# Patient Record
Sex: Female | Born: 1940 | Race: White | Hispanic: No | Smoking: Former smoker
Health system: Southern US, Community
[De-identification: ages and names within clinical notes are randomized; demographics above are authoritative.]

## PROBLEM LIST (undated history)

## (undated) DIAGNOSIS — E785 Hyperlipidemia, unspecified: Secondary | ICD-10-CM

## (undated) DIAGNOSIS — C189 Malignant neoplasm of colon, unspecified: Secondary | ICD-10-CM

## (undated) DIAGNOSIS — Z923 Personal history of irradiation: Secondary | ICD-10-CM

## (undated) DIAGNOSIS — Z9221 Personal history of antineoplastic chemotherapy: Secondary | ICD-10-CM

## (undated) DIAGNOSIS — I700 Atherosclerosis of aorta: Secondary | ICD-10-CM

## (undated) DIAGNOSIS — R918 Other nonspecific abnormal finding of lung field: Secondary | ICD-10-CM

## (undated) DIAGNOSIS — G4700 Insomnia, unspecified: Secondary | ICD-10-CM

## (undated) DIAGNOSIS — D649 Anemia, unspecified: Secondary | ICD-10-CM

---

## 1999-09-07 DIAGNOSIS — C189 Malignant neoplasm of colon, unspecified: Secondary | ICD-10-CM

## 1999-09-07 HISTORY — DX: Malignant neoplasm of colon, unspecified: C18.9

## 2004-07-22 ENCOUNTER — Ambulatory Visit: Payer: Self-pay | Admitting: Internal Medicine

## 2004-08-06 ENCOUNTER — Ambulatory Visit: Payer: Self-pay | Admitting: Internal Medicine

## 2004-09-01 ENCOUNTER — Ambulatory Visit: Payer: Self-pay

## 2004-10-21 ENCOUNTER — Ambulatory Visit: Payer: Self-pay | Admitting: Internal Medicine

## 2004-11-04 ENCOUNTER — Ambulatory Visit: Payer: Self-pay | Admitting: Internal Medicine

## 2004-12-24 ENCOUNTER — Ambulatory Visit: Payer: Self-pay | Admitting: Radiation Oncology

## 2005-01-18 ENCOUNTER — Ambulatory Visit: Payer: Self-pay | Admitting: Internal Medicine

## 2005-02-04 ENCOUNTER — Ambulatory Visit: Payer: Self-pay | Admitting: Internal Medicine

## 2005-04-23 ENCOUNTER — Ambulatory Visit: Payer: Self-pay | Admitting: Internal Medicine

## 2005-05-07 ENCOUNTER — Ambulatory Visit: Payer: Self-pay | Admitting: Internal Medicine

## 2005-07-23 ENCOUNTER — Ambulatory Visit: Payer: Self-pay | Admitting: Internal Medicine

## 2005-08-06 ENCOUNTER — Ambulatory Visit: Payer: Self-pay | Admitting: Internal Medicine

## 2005-09-03 ENCOUNTER — Ambulatory Visit: Payer: Self-pay

## 2005-11-19 ENCOUNTER — Ambulatory Visit: Payer: Self-pay | Admitting: Internal Medicine

## 2005-12-05 ENCOUNTER — Ambulatory Visit: Payer: Self-pay | Admitting: Internal Medicine

## 2006-01-28 ENCOUNTER — Ambulatory Visit: Payer: Self-pay | Admitting: Internal Medicine

## 2006-02-04 ENCOUNTER — Ambulatory Visit: Payer: Self-pay | Admitting: Internal Medicine

## 2006-04-01 ENCOUNTER — Ambulatory Visit: Payer: Self-pay | Admitting: Internal Medicine

## 2006-04-06 ENCOUNTER — Ambulatory Visit: Payer: Self-pay | Admitting: Internal Medicine

## 2006-07-22 ENCOUNTER — Ambulatory Visit: Payer: Self-pay | Admitting: Internal Medicine

## 2006-08-06 ENCOUNTER — Ambulatory Visit: Payer: Self-pay | Admitting: Internal Medicine

## 2006-09-09 ENCOUNTER — Ambulatory Visit: Payer: Self-pay

## 2006-10-26 ENCOUNTER — Ambulatory Visit: Payer: Self-pay | Admitting: Unknown Physician Specialty

## 2006-11-18 ENCOUNTER — Ambulatory Visit: Payer: Self-pay | Admitting: Internal Medicine

## 2006-12-06 ENCOUNTER — Ambulatory Visit: Payer: Self-pay | Admitting: Internal Medicine

## 2007-01-05 ENCOUNTER — Ambulatory Visit: Payer: Self-pay | Admitting: Internal Medicine

## 2007-01-20 ENCOUNTER — Ambulatory Visit: Payer: Self-pay | Admitting: Internal Medicine

## 2007-02-05 ENCOUNTER — Ambulatory Visit: Payer: Self-pay | Admitting: Internal Medicine

## 2007-03-24 ENCOUNTER — Ambulatory Visit: Payer: Self-pay | Admitting: Internal Medicine

## 2007-04-07 ENCOUNTER — Ambulatory Visit: Payer: Self-pay | Admitting: Internal Medicine

## 2007-07-08 ENCOUNTER — Ambulatory Visit: Payer: Self-pay | Admitting: Internal Medicine

## 2007-07-21 ENCOUNTER — Ambulatory Visit: Payer: Self-pay | Admitting: Internal Medicine

## 2007-08-07 ENCOUNTER — Ambulatory Visit: Payer: Self-pay | Admitting: Internal Medicine

## 2007-09-15 ENCOUNTER — Ambulatory Visit: Payer: Self-pay

## 2007-10-20 ENCOUNTER — Ambulatory Visit: Payer: Self-pay | Admitting: Internal Medicine

## 2007-11-05 ENCOUNTER — Ambulatory Visit: Payer: Self-pay | Admitting: Internal Medicine

## 2008-01-05 ENCOUNTER — Ambulatory Visit: Payer: Self-pay | Admitting: Internal Medicine

## 2008-01-19 ENCOUNTER — Ambulatory Visit: Payer: Self-pay | Admitting: Internal Medicine

## 2008-02-05 ENCOUNTER — Ambulatory Visit: Payer: Self-pay | Admitting: Internal Medicine

## 2008-05-17 ENCOUNTER — Ambulatory Visit: Payer: Self-pay | Admitting: Internal Medicine

## 2008-06-06 ENCOUNTER — Ambulatory Visit: Payer: Self-pay | Admitting: Internal Medicine

## 2008-09-06 ENCOUNTER — Ambulatory Visit: Payer: Self-pay | Admitting: Internal Medicine

## 2008-09-13 ENCOUNTER — Ambulatory Visit: Payer: Self-pay | Admitting: Internal Medicine

## 2008-09-20 ENCOUNTER — Ambulatory Visit: Payer: Self-pay

## 2008-10-07 ENCOUNTER — Ambulatory Visit: Payer: Self-pay | Admitting: Internal Medicine

## 2009-01-04 ENCOUNTER — Ambulatory Visit: Payer: Self-pay | Admitting: Internal Medicine

## 2009-01-10 ENCOUNTER — Ambulatory Visit: Payer: Self-pay | Admitting: Internal Medicine

## 2009-02-04 ENCOUNTER — Ambulatory Visit: Payer: Self-pay | Admitting: Internal Medicine

## 2009-05-16 ENCOUNTER — Ambulatory Visit: Payer: Self-pay | Admitting: Internal Medicine

## 2009-06-06 ENCOUNTER — Ambulatory Visit: Payer: Self-pay | Admitting: Internal Medicine

## 2009-09-12 ENCOUNTER — Ambulatory Visit: Payer: Self-pay | Admitting: Internal Medicine

## 2009-09-22 ENCOUNTER — Ambulatory Visit: Payer: Self-pay

## 2009-10-07 ENCOUNTER — Ambulatory Visit: Payer: Self-pay | Admitting: Internal Medicine

## 2010-01-04 ENCOUNTER — Ambulatory Visit: Payer: Self-pay | Admitting: Internal Medicine

## 2010-01-09 ENCOUNTER — Ambulatory Visit: Payer: Self-pay | Admitting: Internal Medicine

## 2010-02-04 ENCOUNTER — Ambulatory Visit: Payer: Self-pay | Admitting: Internal Medicine

## 2010-05-15 ENCOUNTER — Ambulatory Visit: Payer: Self-pay | Admitting: Internal Medicine

## 2010-06-06 ENCOUNTER — Ambulatory Visit: Payer: Self-pay | Admitting: Internal Medicine

## 2010-09-18 ENCOUNTER — Ambulatory Visit: Payer: Self-pay | Admitting: Internal Medicine

## 2010-09-24 LAB — CEA: CEA: 1.1 ng/mL (ref 0.0–4.7)

## 2010-09-30 ENCOUNTER — Ambulatory Visit: Payer: Self-pay

## 2010-10-07 ENCOUNTER — Ambulatory Visit: Payer: Self-pay | Admitting: Internal Medicine

## 2010-12-16 ENCOUNTER — Ambulatory Visit: Payer: Self-pay | Admitting: Unknown Physician Specialty

## 2010-12-17 LAB — PATHOLOGY REPORT

## 2011-01-22 ENCOUNTER — Ambulatory Visit: Payer: Self-pay | Admitting: Internal Medicine

## 2011-01-23 LAB — CEA: CEA: 1.6 ng/mL (ref 0.0–4.7)

## 2011-02-05 ENCOUNTER — Ambulatory Visit: Payer: Self-pay | Admitting: Internal Medicine

## 2011-10-12 ENCOUNTER — Ambulatory Visit: Payer: Self-pay

## 2012-10-13 ENCOUNTER — Ambulatory Visit: Payer: Self-pay

## 2013-10-15 ENCOUNTER — Ambulatory Visit: Payer: Self-pay

## 2014-02-14 ENCOUNTER — Ambulatory Visit: Payer: Self-pay | Admitting: Unknown Physician Specialty

## 2016-01-08 ENCOUNTER — Other Ambulatory Visit: Payer: Self-pay | Admitting: Family Medicine

## 2016-01-08 DIAGNOSIS — Z1231 Encounter for screening mammogram for malignant neoplasm of breast: Secondary | ICD-10-CM

## 2016-01-16 ENCOUNTER — Ambulatory Visit
Admission: RE | Admit: 2016-01-16 | Discharge: 2016-01-16 | Disposition: A | Discharge status: Home / Self Care | Payer: Medicare Other | Source: Ambulatory Visit | Attending: Family Medicine | Admitting: Family Medicine

## 2016-01-16 ENCOUNTER — Other Ambulatory Visit: Payer: Self-pay | Admitting: Family Medicine

## 2016-01-16 DIAGNOSIS — Z1231 Encounter for screening mammogram for malignant neoplasm of breast: Secondary | ICD-10-CM

## 2016-01-16 HISTORY — DX: Malignant neoplasm of colon, unspecified: C18.9

## 2016-12-22 ENCOUNTER — Other Ambulatory Visit: Payer: Self-pay | Admitting: Family Medicine

## 2016-12-22 DIAGNOSIS — Z1231 Encounter for screening mammogram for malignant neoplasm of breast: Secondary | ICD-10-CM

## 2017-01-24 ENCOUNTER — Ambulatory Visit
Admission: RE | Admit: 2017-01-24 | Discharge: 2017-01-24 | Disposition: A | Discharge status: Home / Self Care | Payer: Medicare Other | Source: Ambulatory Visit | Attending: Family Medicine | Admitting: Family Medicine

## 2017-01-24 DIAGNOSIS — Z1231 Encounter for screening mammogram for malignant neoplasm of breast: Secondary | ICD-10-CM | POA: Insufficient documentation

## 2017-01-26 ENCOUNTER — Other Ambulatory Visit: Payer: Self-pay | Admitting: Family Medicine

## 2017-01-26 DIAGNOSIS — R928 Other abnormal and inconclusive findings on diagnostic imaging of breast: Secondary | ICD-10-CM

## 2017-01-26 DIAGNOSIS — R921 Mammographic calcification found on diagnostic imaging of breast: Secondary | ICD-10-CM

## 2017-02-04 ENCOUNTER — Ambulatory Visit
Admission: RE | Admit: 2017-02-04 | Discharge: 2017-02-04 | Disposition: A | Discharge status: Home / Self Care | Payer: Medicare Other | Source: Ambulatory Visit | Attending: Family Medicine | Admitting: Family Medicine

## 2017-02-04 DIAGNOSIS — R921 Mammographic calcification found on diagnostic imaging of breast: Secondary | ICD-10-CM

## 2017-02-04 DIAGNOSIS — R928 Other abnormal and inconclusive findings on diagnostic imaging of breast: Secondary | ICD-10-CM

## 2017-02-10 ENCOUNTER — Other Ambulatory Visit: Payer: Self-pay | Admitting: Family Medicine

## 2017-02-10 DIAGNOSIS — R921 Mammographic calcification found on diagnostic imaging of breast: Secondary | ICD-10-CM

## 2017-02-10 DIAGNOSIS — R928 Other abnormal and inconclusive findings on diagnostic imaging of breast: Secondary | ICD-10-CM

## 2017-02-14 ENCOUNTER — Ambulatory Visit
Admission: RE | Admit: 2017-02-14 | Discharge: 2017-02-14 | Disposition: A | Discharge status: Home / Self Care | Payer: Medicare Other | Source: Ambulatory Visit | Attending: Family Medicine | Admitting: Family Medicine

## 2017-02-14 DIAGNOSIS — R921 Mammographic calcification found on diagnostic imaging of breast: Secondary | ICD-10-CM | POA: Insufficient documentation

## 2017-02-14 DIAGNOSIS — R928 Other abnormal and inconclusive findings on diagnostic imaging of breast: Secondary | ICD-10-CM

## 2017-02-14 DIAGNOSIS — N6021 Fibroadenosis of right breast: Secondary | ICD-10-CM | POA: Diagnosis not present

## 2017-02-14 HISTORY — PX: BREAST BIOPSY: SHX20

## 2017-02-15 LAB — SURGICAL PATHOLOGY

## 2018-01-06 ENCOUNTER — Other Ambulatory Visit: Payer: Self-pay | Admitting: Family Medicine

## 2018-01-06 DIAGNOSIS — Z1231 Encounter for screening mammogram for malignant neoplasm of breast: Secondary | ICD-10-CM

## 2018-02-20 ENCOUNTER — Ambulatory Visit
Admission: RE | Admit: 2018-02-20 | Discharge: 2018-02-20 | Disposition: A | Discharge status: Home / Self Care | Payer: Medicare Other | Source: Ambulatory Visit | Attending: Family Medicine | Admitting: Family Medicine

## 2018-02-20 DIAGNOSIS — Z1231 Encounter for screening mammogram for malignant neoplasm of breast: Secondary | ICD-10-CM | POA: Diagnosis present

## 2018-02-20 HISTORY — DX: Personal history of irradiation: Z92.3

## 2018-02-20 HISTORY — DX: Personal history of antineoplastic chemotherapy: Z92.21

## 2019-01-15 ENCOUNTER — Other Ambulatory Visit: Payer: Self-pay | Admitting: Family Medicine

## 2019-01-15 DIAGNOSIS — Z1231 Encounter for screening mammogram for malignant neoplasm of breast: Secondary | ICD-10-CM

## 2019-03-05 ENCOUNTER — Ambulatory Visit
Admission: RE | Admit: 2019-03-05 | Discharge: 2019-03-05 | Disposition: A | Discharge status: Home / Self Care | Payer: Medicare Other | Source: Ambulatory Visit | Attending: Family Medicine | Admitting: Family Medicine

## 2019-03-05 ENCOUNTER — Other Ambulatory Visit: Payer: Self-pay

## 2019-03-05 DIAGNOSIS — Z1231 Encounter for screening mammogram for malignant neoplasm of breast: Secondary | ICD-10-CM

## 2020-01-30 ENCOUNTER — Other Ambulatory Visit: Payer: Self-pay | Admitting: Family Medicine

## 2020-01-30 DIAGNOSIS — Z1231 Encounter for screening mammogram for malignant neoplasm of breast: Secondary | ICD-10-CM

## 2020-03-11 ENCOUNTER — Ambulatory Visit
Admission: RE | Admit: 2020-03-11 | Discharge: 2020-03-11 | Disposition: A | Discharge status: Home / Self Care | Payer: Medicare Other | Source: Ambulatory Visit | Attending: Family Medicine | Admitting: Family Medicine

## 2020-03-11 DIAGNOSIS — Z1231 Encounter for screening mammogram for malignant neoplasm of breast: Secondary | ICD-10-CM | POA: Insufficient documentation

## 2020-10-27 ENCOUNTER — Other Ambulatory Visit: Payer: Self-pay

## 2020-10-27 ENCOUNTER — Ambulatory Visit (INDEPENDENT_AMBULATORY_CARE_PROVIDER_SITE_OTHER): Payer: Medicare Other | Admitting: Dermatology

## 2020-10-27 DIAGNOSIS — L578 Other skin changes due to chronic exposure to nonionizing radiation: Secondary | ICD-10-CM

## 2020-10-27 DIAGNOSIS — C44319 Basal cell carcinoma of skin of other parts of face: Secondary | ICD-10-CM | POA: Diagnosis not present

## 2020-10-27 DIAGNOSIS — D485 Neoplasm of uncertain behavior of skin: Secondary | ICD-10-CM

## 2020-10-27 NOTE — Progress Notes (Signed)
   New Patient Visit  Subjective  Cindy Finley is a 80 y.o. female who presents for the following: Other (Spot of right cheek that has been there for months and is not healing.).  Referred by Dr Loney Hering  The following portions of the chart were reviewed this encounter and updated as appropriate:   Allergies  Meds  Problems  Med Hx  Surg Hx  Fam Hx     Review of Systems:  No other skin or systemic complaints except as noted in HPI or Assessment and Plan.  Objective  Well appearing patient in no apparent distress; mood and affect are within normal limits.  A focused examination was performed including face. Relevant physical exam findings are noted in the Assessment and Plan.  Objective  Right Buccal Cheek: ~3.0 cm pink patch with 2.5 cm of thickened area at the center        Assessment & Plan  Neoplasm of uncertain behavior of skin Right Buccal Cheek  Skin / nail biopsy Type of biopsy: tangential   Informed consent: discussed and consent obtained   Timeout: patient name, date of birth, surgical site, and procedure verified   Procedure prep:  Patient was prepped and draped in usual sterile fashion Prep type:  Isopropyl alcohol Anesthesia: the lesion was anesthetized in a standard fashion   Anesthetic:  1% lidocaine w/ epinephrine 1-100,000 buffered w/ 8.4% NaHCO3 Instrument used: flexible razor blade   Hemostasis achieved with: pressure, aluminum chloride and electrodesiccation   Outcome: patient tolerated procedure well   Post-procedure details: sterile dressing applied and wound care instructions given   Dressing type: bandage and petrolatum    Specimen 1 - Surgical pathology Differential Diagnosis: BCC vs other  Check Margins: No ~3.0 cm pink patch with 2.5 cm of thickened area at the center  Will plan radiation therapy if +BCC.  Actinic Damage - chronic, secondary to cumulative UV radiation exposure/sun exposure over time - diffuse scaly erythematous  macules with underlying dyspigmentation - Recommend daily broad spectrum sunscreen SPF 30+ to sun-exposed areas, reapply every 2 hours as needed.  - Call for new or changing lesions.  Return in about 6 months (around 04/26/2021) for TBSE.  I, Ashok Cordia, CMA, am acting as scribe for Sarina Ser, MD .  Documentation: I have reviewed the above documentation for accuracy and completeness, and I agree with the above.  Sarina Ser, MD

## 2020-10-27 NOTE — Patient Instructions (Signed)

## 2020-10-28 ENCOUNTER — Encounter: Payer: Self-pay | Admitting: Dermatology

## 2020-10-30 DIAGNOSIS — C4491 Basal cell carcinoma of skin, unspecified: Secondary | ICD-10-CM

## 2020-10-30 HISTORY — DX: Basal cell carcinoma of skin, unspecified: C44.91

## 2020-11-06 ENCOUNTER — Other Ambulatory Visit: Payer: Self-pay

## 2020-11-06 ENCOUNTER — Telehealth: Payer: Self-pay

## 2020-11-06 DIAGNOSIS — C44319 Basal cell carcinoma of skin of other parts of face: Secondary | ICD-10-CM

## 2020-11-06 NOTE — Telephone Encounter (Signed)
Called pt discussed biopsy results  

## 2020-11-06 NOTE — Telephone Encounter (Signed)
-----   Message from Ralene Bathe, MD sent at 10/30/2020  5:29 PM EST ----- Diagnosis Skin , right buccal cheek BASAL CELL CARCINOMA, NODULAR PATTERN  Cancer - BCC As suspected Large lesion Recommend Radiation as discussed.  Please schedule pt with Dr Baruch Gouty at [facility] for Radiation

## 2020-11-06 NOTE — Telephone Encounter (Signed)
Referral faxed to [facility] radiation

## 2020-11-13 ENCOUNTER — Encounter: Payer: Self-pay | Admitting: Radiation Oncology

## 2020-11-13 ENCOUNTER — Ambulatory Visit
Admission: RE | Admit: 2020-11-13 | Discharge: 2020-11-13 | Disposition: A | Discharge status: Home / Self Care | Payer: Medicare Other | Source: Ambulatory Visit | Attending: Radiation Oncology | Admitting: Radiation Oncology

## 2020-11-13 VITALS — BP 183/82 | HR 85 | Temp 97.6°F | Wt 159.5 lb

## 2020-11-13 DIAGNOSIS — Z803 Family history of malignant neoplasm of breast: Secondary | ICD-10-CM | POA: Insufficient documentation

## 2020-11-13 DIAGNOSIS — Z7982 Long term (current) use of aspirin: Secondary | ICD-10-CM | POA: Insufficient documentation

## 2020-11-13 DIAGNOSIS — Z85038 Personal history of other malignant neoplasm of large intestine: Secondary | ICD-10-CM | POA: Insufficient documentation

## 2020-11-13 DIAGNOSIS — Z9221 Personal history of antineoplastic chemotherapy: Secondary | ICD-10-CM | POA: Insufficient documentation

## 2020-11-13 DIAGNOSIS — C44319 Basal cell carcinoma of skin of other parts of face: Secondary | ICD-10-CM | POA: Diagnosis not present

## 2020-11-13 DIAGNOSIS — Z87891 Personal history of nicotine dependence: Secondary | ICD-10-CM | POA: Diagnosis not present

## 2020-11-13 DIAGNOSIS — Z923 Personal history of irradiation: Secondary | ICD-10-CM | POA: Diagnosis not present

## 2020-11-13 NOTE — Consult Note (Signed)
NEW PATIENT EVALUATION  Name: Cindy Finley  MRN: [id number]  Date:   11/13/2020     DOB: 04-05-1941   This 80 y.o. 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  REFERRING PHYSICIAN: Derinda Late, MD  CHIEF COMPLAINT:  Chief Complaint  Patient presents with  . Consult    DIAGNOSIS: The encounter diagnosis was Basal cell carcinoma of skin of other parts of face.   PREVIOUS INVESTIGATIONS:  Pathology report reviewed Clinical notes reviewed  HPI: Patient is a 80 year old female well-known to our department having been treated 20 years prior with neoadjuvant chemoradiation prior to surgery for colon cancer.  She has been followed by dermatology for actinic damage.  She had a 3 cm pink patch of the right cheek which was recently biopsied and positive for nodular type basal cell carcinoma she is wearing a bandage on that which is bleeding minimally.  She is otherwise without complaint.  She is referred to bridge oncology for opinion.  PLANNED TREATMENT REGIMEN: Electron-beam therapy  PAST MEDICAL HISTORY:  has a past medical history of Basal cell carcinoma (10/30/2020), Colon cancer ([place]) (2001), Personal history of chemotherapy, and Personal history of radiation therapy.    PAST SURGICAL HISTORY:  Past Surgical History:  Procedure Laterality Date  . BREAST BIOPSY Right 02/14/2017   benign    FAMILY HISTORY: family history includes Breast cancer (age of onset: 79) in her sister.  SOCIAL HISTORY:  reports that she has quit smoking. She has never used smokeless tobacco.  ALLERGIES: Patient has no known allergies.  MEDICATIONS:  Current Outpatient Medications  Medication Sig Dispense Refill  . aspirin 81 MG EC tablet Take by mouth.    Marland Kitchen b complex vitamins capsule Take 1 capsule by mouth daily.    . Calcium Carbonate-Vitamin D 600-400 MG-UNIT tablet Take by mouth.    . Cholecalciferol 25 MCG (1000 UT) tablet  Take by mouth.    . Cinnamon 500 MG capsule Take by mouth.    . melatonin 3 MG TABS tablet Take by mouth.    . Multiple Vitamin (MULTI-VITAMIN) tablet Take 1 tablet by mouth daily.    . Omega-3 Fatty Acids (FISH OIL) 1000 MG CAPS Take by mouth.    . simvastatin (ZOCOR) 10 MG tablet     . vitamin E 180 MG (400 UNITS) capsule Take by mouth.     No current facility-administered medications for this encounter.    ECOG PERFORMANCE STATUS:  0 - Asymptomatic  REVIEW OF SYSTEMS: Patient denies any weight loss, fatigue, weakness, fever, chills or night sweats. Patient denies any loss of vision, blurred vision. Patient denies any ringing  of the ears or hearing loss. No irregular heartbeat. Patient denies heart murmur or history of fainting. Patient denies any chest pain or pain radiating to her upper extremities. Patient denies any shortness of breath, difficulty breathing at night, cough or hemoptysis. Patient denies any swelling in the lower legs. Patient denies any nausea vomiting, vomiting of blood, or coffee ground material in the vomitus. Patient denies any stomach pain. Patient states has had normal bowel movements no significant constipation or diarrhea. Patient denies any dysuria, hematuria or significant nocturia. Patient denies any problems walking, swelling in the joints or loss of balance. Patient denies any skin changes, loss of hair or loss of weight. Patient denies any excessive worrying or anxiety or significant depression. Patient denies any problems with insomnia. Patient denies excessive thirst, polyuria,  polydipsia. Patient denies any swollen glands, patient denies easy bruising or easy bleeding. Patient denies any recent infections, allergies or URI. Patient "s visual Blanchard have not changed significantly in recent time.   PHYSICAL EXAM: BP (!) 183/82   Pulse 85   Temp 97.6 F (36.4 C) (Tympanic)   Wt 159 lb 8 oz (72.3 kg)  Approximate 3 cm area of erythema with slightly raised  center in the mid right cheek.  No evidence of submental or cervical adenopathy.  Well-developed well-nourished patient in NAD. HEENT reveals PERLA, EOMI, discs not visualized.  Oral cavity is clear. No oral mucosal lesions are identified. Neck is clear without evidence of cervical or supraclavicular adenopathy. Lungs are clear to A&P. Cardiac examination is essentially unremarkable with regular rate and rhythm without murmur rub or thrill. Abdomen is benign with no organomegaly or masses noted. Motor sensory and DTR levels are equal and symmetric in the upper and lower extremities. Cranial nerves II through XII are grossly intact. Proprioception is intact. No peripheral adenopathy or edema is identified. No motor or sensory levels are noted. Crude visual Naser are within normal range.  LABORATORY DATA: Pathology report reviewed    RADIOLOGY RESULTS: No current films to review   IMPRESSION: Basal cell carcinoma the right cheek in 80 year old female  PLAN: At this time I recommended electron-beam therapy to her right cheek.  We will plan on delivering 50 Gray over 5 weeks.  Risks and benefits of treatment occluding possible erythema the skin turning to hyperpigmented skin slight fatigue all were explained in detail to the patient.  I personally set up and ordered CT simulation for early next week.  I have asked the patient to use some larger type bandages to prevent further bleeding from the site.  I would like to take this opportunity to thank you for allowing me to participate in the care of your patient.Noreene Filbert, MD

## 2020-11-17 ENCOUNTER — Ambulatory Visit
Admission: RE | Admit: 2020-11-17 | Discharge: 2020-11-17 | Disposition: A | Discharge status: Home / Self Care | Payer: Medicare Other | Source: Ambulatory Visit | Attending: Radiation Oncology | Admitting: Radiation Oncology

## 2020-11-17 DIAGNOSIS — C44319 Basal cell carcinoma of skin of other parts of face: Secondary | ICD-10-CM | POA: Insufficient documentation

## 2020-11-20 DIAGNOSIS — C44319 Basal cell carcinoma of skin of other parts of face: Secondary | ICD-10-CM | POA: Diagnosis not present

## 2020-11-24 ENCOUNTER — Ambulatory Visit
Admission: RE | Admit: 2020-11-24 | Discharge: 2020-11-24 | Disposition: A | Discharge status: Home / Self Care | Payer: Medicare Other | Source: Ambulatory Visit | Attending: Radiation Oncology | Admitting: Radiation Oncology

## 2020-11-24 ENCOUNTER — Ambulatory Visit: Payer: Medicare Other

## 2020-11-24 DIAGNOSIS — C44319 Basal cell carcinoma of skin of other parts of face: Secondary | ICD-10-CM | POA: Diagnosis not present

## 2020-11-25 ENCOUNTER — Ambulatory Visit
Admission: RE | Admit: 2020-11-25 | Discharge: 2020-11-25 | Disposition: A | Discharge status: Home / Self Care | Payer: Medicare Other | Source: Ambulatory Visit | Attending: Radiation Oncology | Admitting: Radiation Oncology

## 2020-11-25 DIAGNOSIS — C44319 Basal cell carcinoma of skin of other parts of face: Secondary | ICD-10-CM | POA: Diagnosis not present

## 2020-11-26 ENCOUNTER — Ambulatory Visit
Admission: RE | Admit: 2020-11-26 | Discharge: 2020-11-26 | Disposition: A | Discharge status: Home / Self Care | Payer: Medicare Other | Source: Ambulatory Visit | Attending: Radiation Oncology | Admitting: Radiation Oncology

## 2020-11-26 DIAGNOSIS — C44319 Basal cell carcinoma of skin of other parts of face: Secondary | ICD-10-CM | POA: Diagnosis not present

## 2020-11-27 ENCOUNTER — Ambulatory Visit
Admission: RE | Admit: 2020-11-27 | Discharge: 2020-11-27 | Disposition: A | Discharge status: Home / Self Care | Payer: Medicare Other | Source: Ambulatory Visit | Attending: Radiation Oncology | Admitting: Radiation Oncology

## 2020-11-27 DIAGNOSIS — C44319 Basal cell carcinoma of skin of other parts of face: Secondary | ICD-10-CM | POA: Diagnosis not present

## 2020-11-28 ENCOUNTER — Ambulatory Visit
Admission: RE | Admit: 2020-11-28 | Discharge: 2020-11-28 | Disposition: A | Discharge status: Home / Self Care | Payer: Medicare Other | Source: Ambulatory Visit | Attending: Radiation Oncology | Admitting: Radiation Oncology

## 2020-11-28 DIAGNOSIS — C44319 Basal cell carcinoma of skin of other parts of face: Secondary | ICD-10-CM | POA: Diagnosis not present

## 2020-12-01 ENCOUNTER — Ambulatory Visit
Admission: RE | Admit: 2020-12-01 | Discharge: 2020-12-01 | Disposition: A | Discharge status: Home / Self Care | Payer: Medicare Other | Source: Ambulatory Visit | Attending: Radiation Oncology | Admitting: Radiation Oncology

## 2020-12-01 DIAGNOSIS — C44319 Basal cell carcinoma of skin of other parts of face: Secondary | ICD-10-CM | POA: Diagnosis not present

## 2020-12-02 ENCOUNTER — Ambulatory Visit
Admission: RE | Admit: 2020-12-02 | Discharge: 2020-12-02 | Disposition: A | Discharge status: Home / Self Care | Payer: Medicare Other | Source: Ambulatory Visit | Attending: Radiation Oncology | Admitting: Radiation Oncology

## 2020-12-02 DIAGNOSIS — C44319 Basal cell carcinoma of skin of other parts of face: Secondary | ICD-10-CM | POA: Diagnosis not present

## 2020-12-03 ENCOUNTER — Ambulatory Visit
Admission: RE | Admit: 2020-12-03 | Discharge: 2020-12-03 | Disposition: A | Discharge status: Home / Self Care | Payer: Medicare Other | Source: Ambulatory Visit | Attending: Radiation Oncology | Admitting: Radiation Oncology

## 2020-12-03 DIAGNOSIS — C44319 Basal cell carcinoma of skin of other parts of face: Secondary | ICD-10-CM | POA: Diagnosis not present

## 2020-12-04 ENCOUNTER — Ambulatory Visit
Admission: RE | Admit: 2020-12-04 | Discharge: 2020-12-04 | Disposition: A | Discharge status: Home / Self Care | Payer: Medicare Other | Source: Ambulatory Visit | Attending: Radiation Oncology | Admitting: Radiation Oncology

## 2020-12-04 DIAGNOSIS — C44319 Basal cell carcinoma of skin of other parts of face: Secondary | ICD-10-CM | POA: Diagnosis not present

## 2020-12-05 ENCOUNTER — Ambulatory Visit
Admission: RE | Admit: 2020-12-05 | Discharge: 2020-12-05 | Disposition: A | Discharge status: Home / Self Care | Payer: Medicare Other | Source: Ambulatory Visit | Attending: Radiation Oncology | Admitting: Radiation Oncology

## 2020-12-05 DIAGNOSIS — C44319 Basal cell carcinoma of skin of other parts of face: Secondary | ICD-10-CM | POA: Insufficient documentation

## 2020-12-08 ENCOUNTER — Ambulatory Visit
Admission: RE | Admit: 2020-12-08 | Discharge: 2020-12-08 | Disposition: A | Discharge status: Home / Self Care | Payer: Medicare Other | Source: Ambulatory Visit | Attending: Radiation Oncology | Admitting: Radiation Oncology

## 2020-12-08 DIAGNOSIS — C44319 Basal cell carcinoma of skin of other parts of face: Secondary | ICD-10-CM | POA: Diagnosis not present

## 2020-12-09 ENCOUNTER — Ambulatory Visit
Admission: RE | Admit: 2020-12-09 | Discharge: 2020-12-09 | Disposition: A | Discharge status: Home / Self Care | Payer: Medicare Other | Source: Ambulatory Visit | Attending: Radiation Oncology | Admitting: Radiation Oncology

## 2020-12-09 DIAGNOSIS — C44319 Basal cell carcinoma of skin of other parts of face: Secondary | ICD-10-CM | POA: Diagnosis not present

## 2020-12-10 ENCOUNTER — Ambulatory Visit
Admission: RE | Admit: 2020-12-10 | Discharge: 2020-12-10 | Disposition: A | Discharge status: Home / Self Care | Payer: Medicare Other | Source: Ambulatory Visit | Attending: Radiation Oncology | Admitting: Radiation Oncology

## 2020-12-10 DIAGNOSIS — C44319 Basal cell carcinoma of skin of other parts of face: Secondary | ICD-10-CM | POA: Diagnosis not present

## 2020-12-11 ENCOUNTER — Ambulatory Visit
Admission: RE | Admit: 2020-12-11 | Discharge: 2020-12-11 | Disposition: A | Discharge status: Home / Self Care | Payer: Medicare Other | Source: Ambulatory Visit | Attending: Radiation Oncology | Admitting: Radiation Oncology

## 2020-12-11 DIAGNOSIS — C44319 Basal cell carcinoma of skin of other parts of face: Secondary | ICD-10-CM | POA: Diagnosis not present

## 2020-12-12 ENCOUNTER — Ambulatory Visit
Admission: RE | Admit: 2020-12-12 | Discharge: 2020-12-12 | Disposition: A | Discharge status: Home / Self Care | Payer: Medicare Other | Source: Ambulatory Visit | Attending: Radiation Oncology | Admitting: Radiation Oncology

## 2020-12-12 DIAGNOSIS — C44319 Basal cell carcinoma of skin of other parts of face: Secondary | ICD-10-CM | POA: Diagnosis not present

## 2020-12-15 ENCOUNTER — Ambulatory Visit
Admission: RE | Admit: 2020-12-15 | Discharge: 2020-12-15 | Disposition: A | Discharge status: Home / Self Care | Payer: Medicare Other | Source: Ambulatory Visit | Attending: Radiation Oncology | Admitting: Radiation Oncology

## 2020-12-15 DIAGNOSIS — C44319 Basal cell carcinoma of skin of other parts of face: Secondary | ICD-10-CM | POA: Diagnosis not present

## 2020-12-16 ENCOUNTER — Ambulatory Visit
Admission: RE | Admit: 2020-12-16 | Discharge: 2020-12-16 | Disposition: A | Discharge status: Home / Self Care | Payer: Medicare Other | Source: Ambulatory Visit | Attending: Radiation Oncology | Admitting: Radiation Oncology

## 2020-12-16 DIAGNOSIS — C44319 Basal cell carcinoma of skin of other parts of face: Secondary | ICD-10-CM | POA: Diagnosis not present

## 2020-12-17 ENCOUNTER — Ambulatory Visit
Admission: RE | Admit: 2020-12-17 | Discharge: 2020-12-17 | Disposition: A | Discharge status: Home / Self Care | Payer: Medicare Other | Source: Ambulatory Visit | Attending: Radiation Oncology | Admitting: Radiation Oncology

## 2020-12-17 DIAGNOSIS — C44319 Basal cell carcinoma of skin of other parts of face: Secondary | ICD-10-CM | POA: Diagnosis not present

## 2020-12-18 ENCOUNTER — Ambulatory Visit
Admission: RE | Admit: 2020-12-18 | Discharge: 2020-12-18 | Disposition: A | Discharge status: Home / Self Care | Payer: Medicare Other | Source: Ambulatory Visit | Attending: Radiation Oncology | Admitting: Radiation Oncology

## 2020-12-18 DIAGNOSIS — C44319 Basal cell carcinoma of skin of other parts of face: Secondary | ICD-10-CM | POA: Diagnosis not present

## 2020-12-19 ENCOUNTER — Ambulatory Visit
Admission: RE | Admit: 2020-12-19 | Discharge: 2020-12-19 | Disposition: A | Discharge status: Home / Self Care | Payer: Medicare Other | Source: Ambulatory Visit | Attending: Radiation Oncology | Admitting: Radiation Oncology

## 2020-12-19 DIAGNOSIS — C44319 Basal cell carcinoma of skin of other parts of face: Secondary | ICD-10-CM | POA: Diagnosis not present

## 2020-12-22 ENCOUNTER — Ambulatory Visit
Admission: RE | Admit: 2020-12-22 | Discharge: 2020-12-22 | Disposition: A | Discharge status: Home / Self Care | Payer: Medicare Other | Source: Ambulatory Visit | Attending: Radiation Oncology | Admitting: Radiation Oncology

## 2020-12-22 DIAGNOSIS — C44319 Basal cell carcinoma of skin of other parts of face: Secondary | ICD-10-CM | POA: Diagnosis not present

## 2020-12-23 ENCOUNTER — Ambulatory Visit
Admission: RE | Admit: 2020-12-23 | Discharge: 2020-12-23 | Disposition: A | Discharge status: Home / Self Care | Payer: Medicare Other | Source: Ambulatory Visit | Attending: Radiation Oncology | Admitting: Radiation Oncology

## 2020-12-23 DIAGNOSIS — C44319 Basal cell carcinoma of skin of other parts of face: Secondary | ICD-10-CM | POA: Diagnosis not present

## 2020-12-24 ENCOUNTER — Ambulatory Visit
Admission: RE | Admit: 2020-12-24 | Discharge: 2020-12-24 | Disposition: A | Discharge status: Home / Self Care | Payer: Medicare Other | Source: Ambulatory Visit | Attending: Radiation Oncology | Admitting: Radiation Oncology

## 2020-12-24 DIAGNOSIS — C44319 Basal cell carcinoma of skin of other parts of face: Secondary | ICD-10-CM | POA: Diagnosis not present

## 2020-12-25 ENCOUNTER — Ambulatory Visit
Admission: RE | Admit: 2020-12-25 | Discharge: 2020-12-25 | Disposition: A | Discharge status: Home / Self Care | Payer: Medicare Other | Source: Ambulatory Visit | Attending: Radiation Oncology | Admitting: Radiation Oncology

## 2020-12-25 DIAGNOSIS — C44319 Basal cell carcinoma of skin of other parts of face: Secondary | ICD-10-CM | POA: Diagnosis not present

## 2020-12-26 ENCOUNTER — Ambulatory Visit
Admission: RE | Admit: 2020-12-26 | Discharge: 2020-12-26 | Disposition: A | Discharge status: Home / Self Care | Payer: Medicare Other | Source: Ambulatory Visit | Attending: Radiation Oncology | Admitting: Radiation Oncology

## 2020-12-26 DIAGNOSIS — C44319 Basal cell carcinoma of skin of other parts of face: Secondary | ICD-10-CM | POA: Diagnosis not present

## 2020-12-29 ENCOUNTER — Ambulatory Visit: Payer: Medicare Other

## 2021-01-30 ENCOUNTER — Other Ambulatory Visit: Payer: Self-pay | Admitting: Family Medicine

## 2021-01-30 DIAGNOSIS — Z1231 Encounter for screening mammogram for malignant neoplasm of breast: Secondary | ICD-10-CM

## 2021-02-06 ENCOUNTER — Encounter: Payer: Self-pay | Admitting: Radiation Oncology

## 2021-02-06 ENCOUNTER — Encounter (INDEPENDENT_AMBULATORY_CARE_PROVIDER_SITE_OTHER): Payer: Self-pay

## 2021-02-06 ENCOUNTER — Ambulatory Visit
Admission: RE | Admit: 2021-02-06 | Discharge: 2021-02-06 | Disposition: A | Discharge status: Home / Self Care | Payer: Medicare Other | Source: Ambulatory Visit | Attending: Radiation Oncology | Admitting: Radiation Oncology

## 2021-02-06 VITALS — BP 173/81 | HR 91 | Temp 97.7°F | Resp 16 | Wt 158.0 lb

## 2021-02-06 DIAGNOSIS — C44319 Basal cell carcinoma of skin of other parts of face: Secondary | ICD-10-CM | POA: Diagnosis not present

## 2021-02-06 DIAGNOSIS — Z923 Personal history of irradiation: Secondary | ICD-10-CM | POA: Diagnosis not present

## 2021-02-06 NOTE — Progress Notes (Signed)
Radiation Oncology Follow up Note  Name: Cindy Finley   Date:   02/06/2021 MRN:  [id number] DOB: Jul 05, 1941    This 80 y.o. female presents to the clinic today for 1 month follow-up status post electron-beam therapy to her right cheek for nodular basal cell carcinoma.  REFERRING PROVIDER: Derinda Late, MD  HPI: Patient is an 80 year old female now at 1 month having completed electron-beam therapy to her right cheek for nodular basal cell carcinoma seen today in routine follow-up she is doing well.  She had a complete response only with some minor skin changes still located in the area of treatment.  She is without complaints..  COMPLICATIONS OF TREATMENT: none  FOLLOW UP COMPLIANCE: keeps appointments   PHYSICAL EXAM:  BP (!) 173/81 (BP Location: Left Arm, Patient Position: Sitting)   Pulse 91   Temp 97.7 F (36.5 C) (Tympanic)   Resp 16   Wt 158 lb (71.7 kg)  Slight area of erythematous change consistent with area of previous treatment.  No nodularity noted.  No evidence of head or neck adenopathy is appreciated.  Well-developed well-nourished patient in NAD. HEENT reveals PERLA, EOMI, discs not visualized.  Oral cavity is clear. No oral mucosal lesions are identified. Neck is clear without evidence of cervical or supraclavicular adenopathy. Lungs are clear to A&P. Cardiac examination is essentially unremarkable with regular rate and rhythm without murmur rub or thrill. Abdomen is benign with no organomegaly or masses noted. Motor sensory and DTR levels are equal and symmetric in the upper and lower extremities. Cranial nerves II through XII are grossly intact. Proprioception is intact. No peripheral adenopathy or edema is identified. No motor or sensory levels are noted. Crude visual Frommer are within normal range.  RADIOLOGY RESULTS: No current films for review  PLAN: Present time patient is an excellent result from electron-beam therapy to her right cheek.  And pleased with her  overall progress.  She will continue follow-up care with Dr. Nehemiah Massed in dermatology.  I have asked to see her back in 4 to 5 months and then will go out 6 months for follow-up.  Patient knows to call with any concerns.  I have assured her the skin color will again return to normal in several months.  I would like to take this opportunity to thank you for allowing me to participate in the care of your patient.Noreene Filbert, MD

## 2021-03-17 ENCOUNTER — Other Ambulatory Visit: Payer: Self-pay

## 2021-03-17 ENCOUNTER — Ambulatory Visit
Admission: RE | Admit: 2021-03-17 | Discharge: 2021-03-17 | Disposition: A | Discharge status: Home / Self Care | Payer: Medicare Other | Source: Ambulatory Visit | Attending: Family Medicine | Admitting: Family Medicine

## 2021-03-17 DIAGNOSIS — Z1231 Encounter for screening mammogram for malignant neoplasm of breast: Secondary | ICD-10-CM | POA: Diagnosis present

## 2021-04-30 ENCOUNTER — Ambulatory Visit: Payer: Medicare Other | Admitting: Dermatology

## 2021-07-27 ENCOUNTER — Encounter: Payer: Self-pay | Admitting: Radiation Oncology

## 2021-07-27 ENCOUNTER — Other Ambulatory Visit: Payer: Self-pay

## 2021-07-27 ENCOUNTER — Ambulatory Visit
Admission: RE | Admit: 2021-07-27 | Discharge: 2021-07-27 | Disposition: A | Discharge status: Home / Self Care | Payer: Medicare Other | Source: Ambulatory Visit | Attending: Radiation Oncology | Admitting: Radiation Oncology

## 2021-07-27 VITALS — BP 162/96 | HR 86 | Wt 160.7 lb

## 2021-07-27 DIAGNOSIS — C44319 Basal cell carcinoma of skin of other parts of face: Secondary | ICD-10-CM | POA: Insufficient documentation

## 2021-07-27 DIAGNOSIS — Z923 Personal history of irradiation: Secondary | ICD-10-CM | POA: Insufficient documentation

## 2021-07-27 NOTE — Progress Notes (Signed)
Radiation Oncology Follow up Note  Name: Cindy Finley   Date:   07/27/2021 MRN:  [id number] DOB: 05-Apr-1941    This 80 y.o. female presents to the clinic today for 87-month follow-up status post electron-beam therapy to her right cheek for a nodular basal cell carcinoma.  REFERRING PROVIDER: Derinda Late, MD  HPI: Patient is a 80 year old female now out over 6 months having completed electron-beam therapy to her right cheek for nodular basal cell carcinoma.  Seen today in routine follow-up she is doing well.  She specifically denies any new skin lesions..  She otherwise is doing well.  She is not set up follow-up appointments with dermatology..  COMPLICATIONS OF TREATMENT: none  FOLLOW UP COMPLIANCE: keeps appointments   PHYSICAL EXAM:  BP (!) 162/96   Pulse 86   Wt 160 lb 11.2 oz (72.9 kg)  Area of previous basal cell carcinoma is completely clear no evidence of other facial lesions are identified.  No evidence of cervical or supraclavicular adenopathy is appreciated.  Well-developed well-nourished patient in NAD. HEENT reveals PERLA, EOMI, discs not visualized.  Oral cavity is clear. No oral mucosal lesions are identified. Neck is clear without evidence of cervical or supraclavicular adenopathy. Lungs are clear to A&P. Cardiac examination is essentially unremarkable with regular rate and rhythm without murmur rub or thrill. Abdomen is benign with no organomegaly or masses noted. Motor sensory and DTR levels are equal and symmetric in the upper and lower extremities. Cranial nerves II through XII are grossly intact. Proprioception is intact. No peripheral adenopathy or edema is identified. No motor or sensory levels are noted. Crude visual Kuechle are within normal range.  RADIOLOGY RESULTS: No current films to review  PLAN: Present time she continues to do well with complete response to electron-beam therapy to a basal cell carcinoma the right cheek.  I am going to turn follow-up care  over to dermatology.  I have asked the patient to set up a follow-up appointments with Dr. Nehemiah Massed.  Patient knows I am here for her should she have any problems or concerns in the future.  I would like to take this opportunity to thank you for allowing me to participate in the care of your patient.Noreene Filbert, MD

## 2021-09-05 IMAGING — MG MM DIGITAL SCREENING BILAT W/ TOMO AND CAD
8 of 14 series · 8 of 40 positions shown · non-contrast
Comparison: Previous exam(s).

CLINICAL DATA: Screening.

EXAM:
DIGITAL SCREENING BILATERAL MAMMOGRAM WITH TOMOSYNTHESIS AND CAD
TECHNIQUE: Bilateral screening digital craniocaudal and mediolateral oblique
mammograms were obtained. Bilateral screening digital breast
tomosynthesis was performed. The images were evaluated with
computer-aided detection.

[R CC synth-2D (1 of 2)]
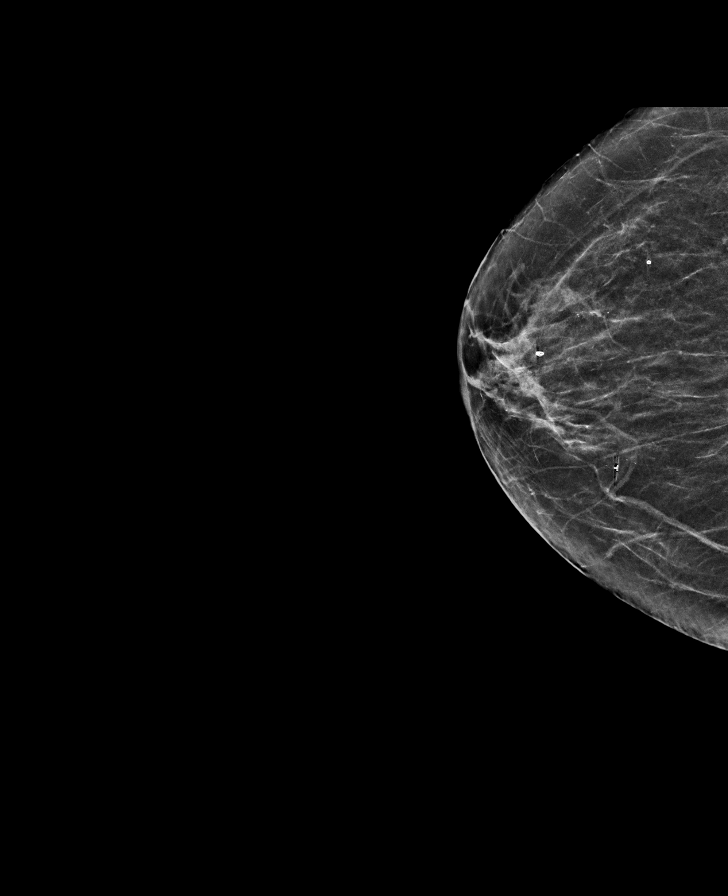

[L CC synth-2D (1 of 2)]
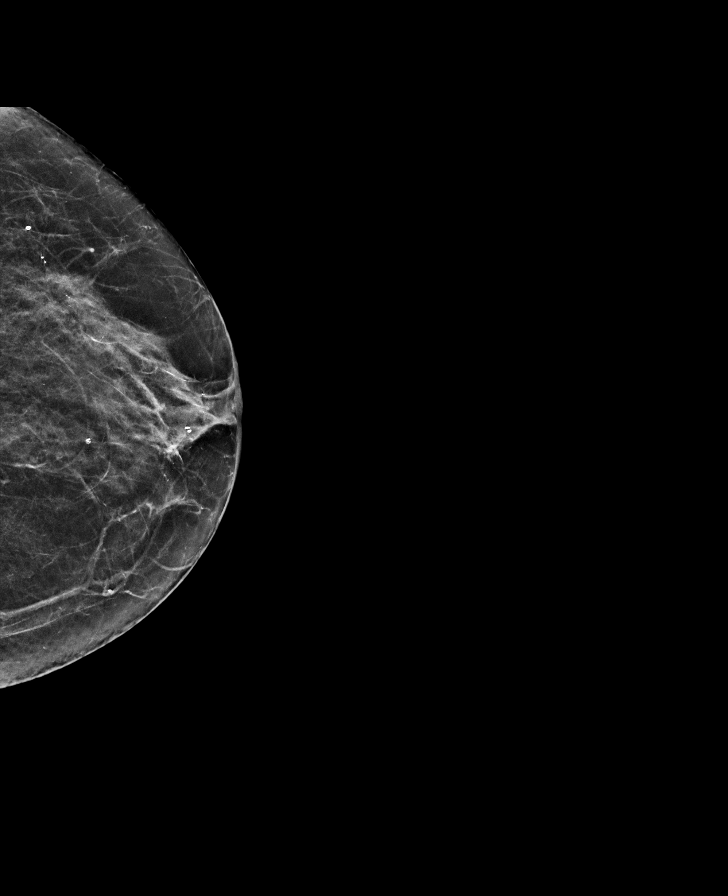

[R CC synth-2D (2 of 2)]
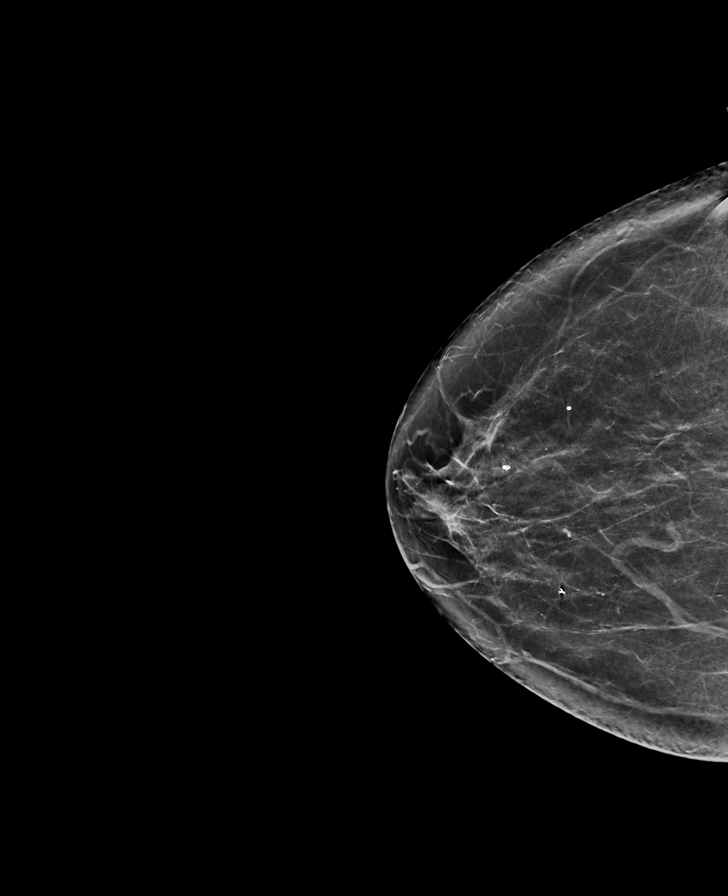

[R MLO synth-2D]
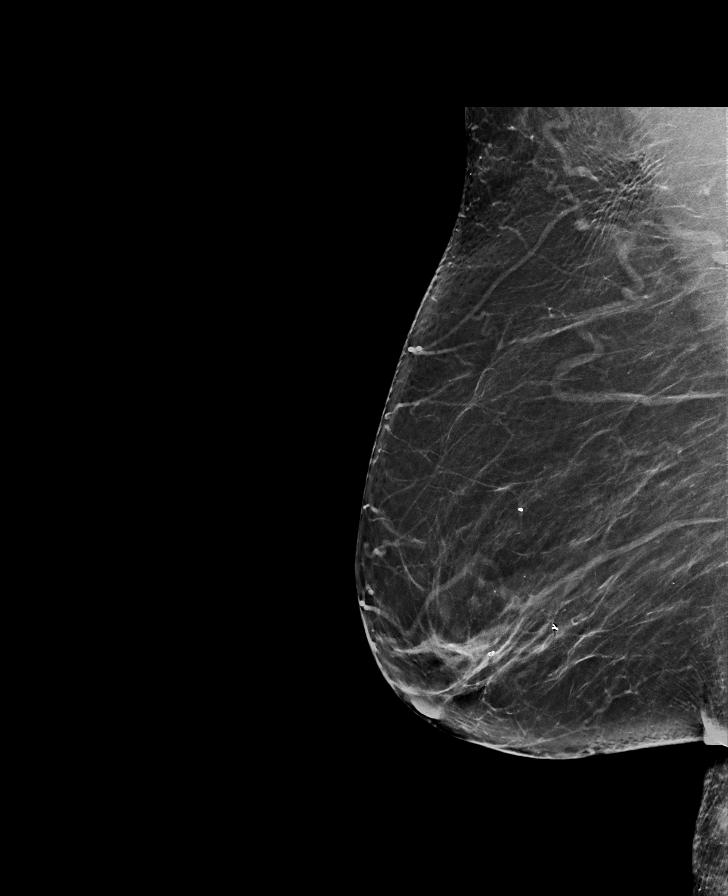

[L CC synth-2D (2 of 2)]
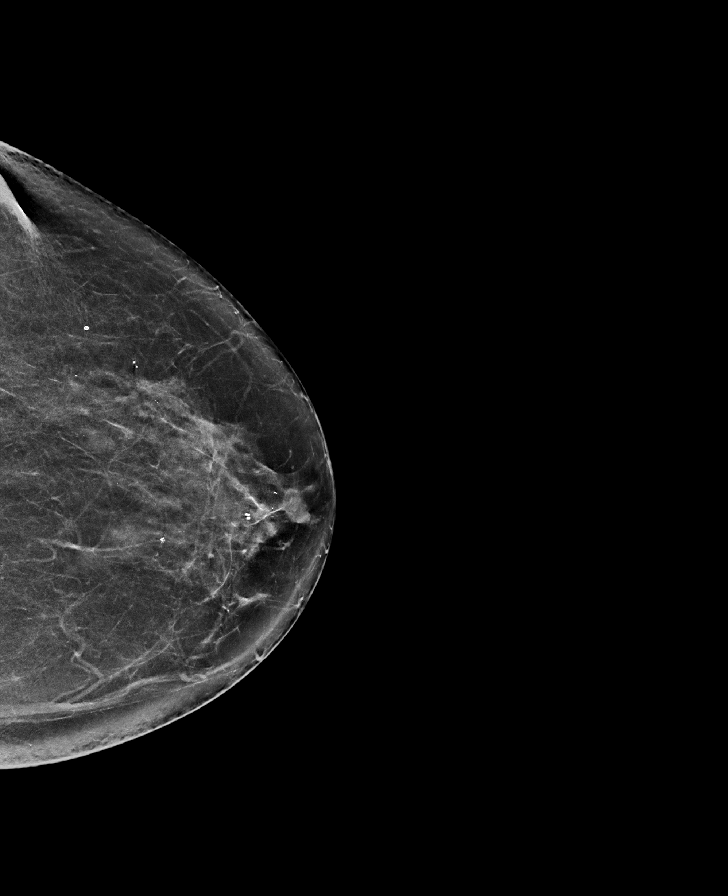

[L MLO synth-2D (1 of 2)]
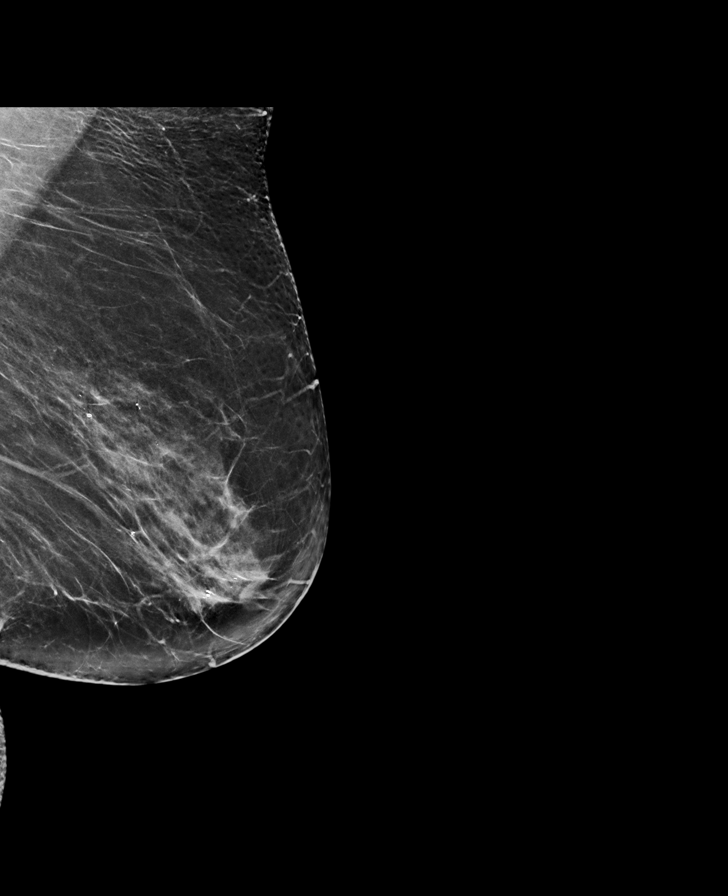

[L MLO synth-2D (2 of 2)]
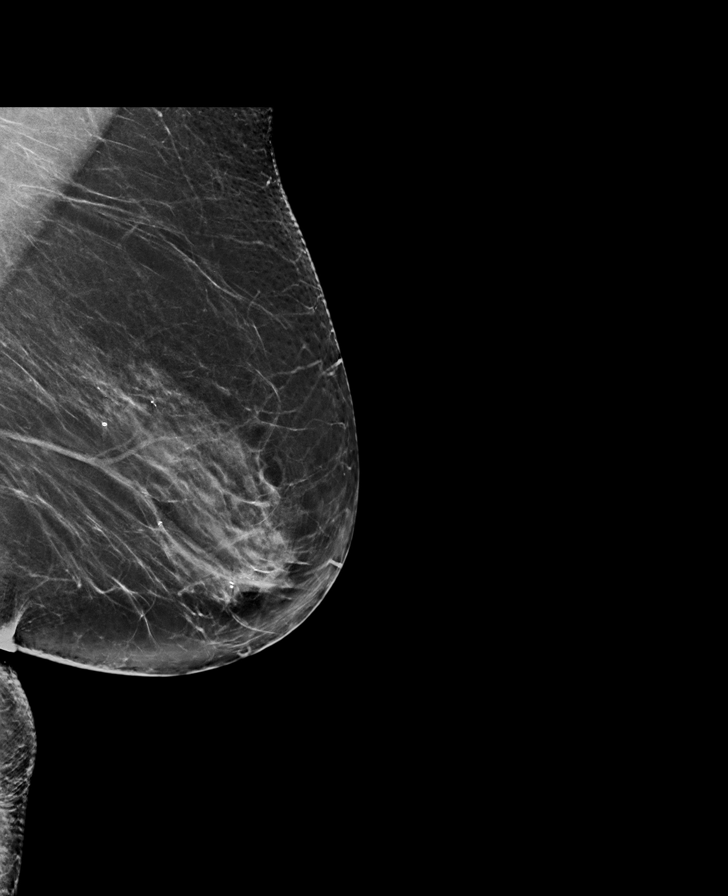

[L MLO tomo · tomo slice 37/74.0]
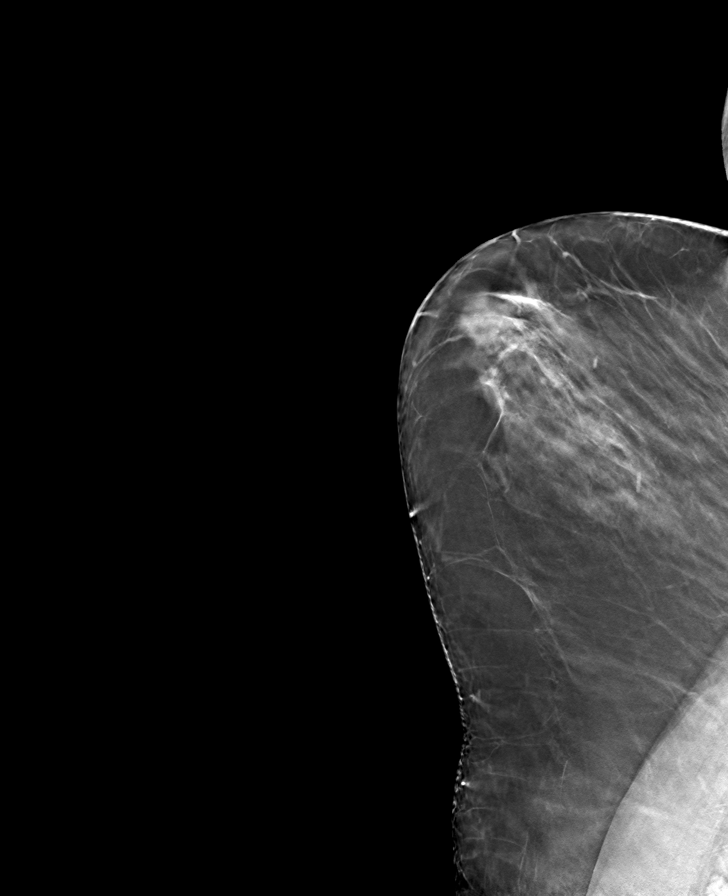

[8 of 40 positions shown; findings below may reference images not displayed]

ACR Breast Density Category b: There are scattered areas of
fibroglandular density.
FINDINGS: There are no findings suspicious for malignancy.
IMPRESSION: No mammographic evidence of malignancy. A result letter of this
screening mammogram will be mailed directly to the patient.

RECOMMENDATION:
Screening mammogram in one year. (Code:51-O-LD2)

BI-RADS CATEGORY  1: Negative.

## 2022-02-09 ENCOUNTER — Other Ambulatory Visit: Payer: Self-pay | Admitting: Family Medicine

## 2022-02-09 DIAGNOSIS — Z1231 Encounter for screening mammogram for malignant neoplasm of breast: Secondary | ICD-10-CM

## 2022-03-23 ENCOUNTER — Ambulatory Visit
Admission: RE | Admit: 2022-03-23 | Discharge: 2022-03-23 | Disposition: A | Discharge status: Home / Self Care | Payer: Medicare Other | Source: Ambulatory Visit | Attending: Family Medicine | Admitting: Family Medicine

## 2022-03-23 DIAGNOSIS — Z1231 Encounter for screening mammogram for malignant neoplasm of breast: Secondary | ICD-10-CM | POA: Insufficient documentation

## 2023-02-11 ENCOUNTER — Other Ambulatory Visit: Payer: Self-pay

## 2023-02-11 DIAGNOSIS — Z1231 Encounter for screening mammogram for malignant neoplasm of breast: Secondary | ICD-10-CM

## 2023-03-29 ENCOUNTER — Ambulatory Visit
Admission: RE | Admit: 2023-03-29 | Discharge: 2023-03-29 | Disposition: A | Discharge status: Home / Self Care | Payer: Medicare Other | Source: Ambulatory Visit | Attending: Family Medicine | Admitting: Family Medicine

## 2023-03-29 DIAGNOSIS — Z1231 Encounter for screening mammogram for malignant neoplasm of breast: Secondary | ICD-10-CM | POA: Insufficient documentation

## 2024-02-17 ENCOUNTER — Other Ambulatory Visit: Payer: Self-pay | Admitting: Family Medicine

## 2024-02-17 DIAGNOSIS — Z1231 Encounter for screening mammogram for malignant neoplasm of breast: Secondary | ICD-10-CM

## 2024-04-03 ENCOUNTER — Ambulatory Visit
Admission: RE | Admit: 2024-04-03 | Discharge: 2024-04-03 | Disposition: A | Discharge status: Home / Self Care | Source: Ambulatory Visit | Attending: Family Medicine | Admitting: Family Medicine

## 2024-04-03 DIAGNOSIS — Z1231 Encounter for screening mammogram for malignant neoplasm of breast: Secondary | ICD-10-CM | POA: Diagnosis present

## 2024-08-22 ENCOUNTER — Ambulatory Visit: Admitting: Urology

## 2024-08-22 ENCOUNTER — Inpatient Hospital Stay
Admission: EM | Admit: 2024-08-22 | Discharge: 2024-08-25 | DRG: 872 | Disposition: A | Discharge status: Home / Self Care | Source: Ambulatory Visit | Attending: Internal Medicine | Admitting: Internal Medicine

## 2024-08-22 ENCOUNTER — Encounter: Payer: Self-pay | Admitting: Urology

## 2024-08-22 ENCOUNTER — Observation Stay

## 2024-08-22 VITALS — BP 128/66 | HR 147 | Temp 98.0°F | Wt 160.7 lb

## 2024-08-22 DIAGNOSIS — Z66 Do not resuscitate: Secondary | ICD-10-CM | POA: Diagnosis present

## 2024-08-22 DIAGNOSIS — R310 Gross hematuria: Secondary | ICD-10-CM

## 2024-08-22 DIAGNOSIS — A419 Sepsis, unspecified organism: Principal | ICD-10-CM | POA: Diagnosis present

## 2024-08-22 DIAGNOSIS — D649 Anemia, unspecified: Secondary | ICD-10-CM | POA: Diagnosis not present

## 2024-08-22 DIAGNOSIS — D62 Acute posthemorrhagic anemia: Secondary | ICD-10-CM | POA: Diagnosis present

## 2024-08-22 DIAGNOSIS — Z7982 Long term (current) use of aspirin: Secondary | ICD-10-CM

## 2024-08-22 DIAGNOSIS — N3001 Acute cystitis with hematuria: Secondary | ICD-10-CM | POA: Diagnosis not present

## 2024-08-22 DIAGNOSIS — Z9221 Personal history of antineoplastic chemotherapy: Secondary | ICD-10-CM

## 2024-08-22 DIAGNOSIS — E872 Acidosis, unspecified: Secondary | ICD-10-CM | POA: Diagnosis present

## 2024-08-22 DIAGNOSIS — N3000 Acute cystitis without hematuria: Secondary | ICD-10-CM

## 2024-08-22 DIAGNOSIS — E785 Hyperlipidemia, unspecified: Secondary | ICD-10-CM | POA: Diagnosis present

## 2024-08-22 DIAGNOSIS — Z87891 Personal history of nicotine dependence: Secondary | ICD-10-CM

## 2024-08-22 DIAGNOSIS — Z923 Personal history of irradiation: Secondary | ICD-10-CM

## 2024-08-22 DIAGNOSIS — N321 Vesicointestinal fistula: Secondary | ICD-10-CM | POA: Diagnosis present

## 2024-08-22 DIAGNOSIS — Z79899 Other long term (current) drug therapy: Secondary | ICD-10-CM

## 2024-08-22 DIAGNOSIS — Z87440 Personal history of urinary (tract) infections: Secondary | ICD-10-CM

## 2024-08-22 DIAGNOSIS — Z85828 Personal history of other malignant neoplasm of skin: Secondary | ICD-10-CM

## 2024-08-22 DIAGNOSIS — Z85038 Personal history of other malignant neoplasm of large intestine: Secondary | ICD-10-CM

## 2024-08-22 DIAGNOSIS — Z803 Family history of malignant neoplasm of breast: Secondary | ICD-10-CM

## 2024-08-22 DIAGNOSIS — B952 Enterococcus as the cause of diseases classified elsewhere: Secondary | ICD-10-CM | POA: Diagnosis present

## 2024-08-22 LAB — COMPREHENSIVE METABOLIC PANEL WITH GFR
ALT: 24 U/L (ref 0–44)
AST: 49 U/L — ABNORMAL HIGH (ref 15–41)
Albumin: 3.9 g/dL (ref 3.5–5.0)
Alkaline Phosphatase: 65 U/L (ref 38–126)
Anion gap: 16 — ABNORMAL HIGH (ref 5–15)
BUN: 41 mg/dL — ABNORMAL HIGH (ref 8–23)
CO2: 20 mmol/L — ABNORMAL LOW (ref 22–32)
Calcium: 9.2 mg/dL (ref 8.9–10.3)
Chloride: 99 mmol/L (ref 98–111)
Creatinine, Ser: 1.02 mg/dL — ABNORMAL HIGH (ref 0.44–1.00)
GFR, Estimated: 54 mL/min — ABNORMAL LOW (ref >60)
Glucose, Bld: 131 mg/dL — ABNORMAL HIGH (ref 70–99)
Potassium: 3.9 mmol/L (ref 3.5–5.1)
Sodium: 135 mmol/L (ref 135–145)
Total Bilirubin: 0.4 mg/dL (ref 0.0–1.2)
Total Protein: 6.6 g/dL (ref 6.5–8.1)

## 2024-08-22 LAB — URINALYSIS, W/ REFLEX TO CULTURE (INFECTION SUSPECTED)
Bilirubin Urine: NEGATIVE
Glucose, UA: NEGATIVE mg/dL
Ketones, ur: 5 mg/dL — AB
Leukocytes,Ua: NEGATIVE
Nitrite: NEGATIVE
Protein, ur: 100 mg/dL — AB
RBC / HPF: >50 RBC/hpf (ref 0–5)
Specific Gravity, Urine: 1.018 (ref 1.005–1.030)
Squamous Epithelial / HPF: 0 /HPF (ref 0–5)
WBC, UA: >50 WBC/hpf (ref 0–5)
pH: 8 (ref 5.0–8.0)

## 2024-08-22 LAB — CBC WITH DIFFERENTIAL/PLATELET
Abs Immature Granulocytes: 0.16 K/uL — ABNORMAL HIGH (ref 0.00–0.07)
Basophils Absolute: 0 K/uL (ref 0.0–0.1)
Basophils Relative: 0 %
Eosinophils Absolute: 0 K/uL (ref 0.0–0.5)
Eosinophils Relative: 0 %
HCT: 17.2 % — ABNORMAL LOW (ref 36.0–46.0)
Hemoglobin: 5.5 g/dL — ABNORMAL LOW (ref 12.0–15.0)
Immature Granulocytes: 1 %
Lymphocytes Relative: 4 %
Lymphs Abs: 0.9 K/uL (ref 0.7–4.0)
MCH: 30.6 pg (ref 26.0–34.0)
MCHC: 32 g/dL (ref 30.0–36.0)
MCV: 95.6 fL (ref 80.0–100.0)
Monocytes Absolute: 2 K/uL — ABNORMAL HIGH (ref 0.1–1.0)
Monocytes Relative: 10 %
Neutro Abs: 17 K/uL — ABNORMAL HIGH (ref 1.7–7.7)
Neutrophils Relative %: 85 %
Platelets: 469 K/uL — ABNORMAL HIGH (ref 150–400)
RBC: 1.8 MIL/uL — ABNORMAL LOW (ref 3.87–5.11)
RDW: 14 % (ref 11.5–15.5)
WBC: 20.1 K/uL — ABNORMAL HIGH (ref 4.0–10.5)
nRBC: 0.3 % — ABNORMAL HIGH (ref 0.0–0.2)

## 2024-08-22 LAB — CBC
HCT: 23.8 % — ABNORMAL LOW (ref 36.0–46.0)
Hemoglobin: 8.1 g/dL — ABNORMAL LOW (ref 12.0–15.0)
MCH: 29.6 pg (ref 26.0–34.0)
MCHC: 34 g/dL (ref 30.0–36.0)
MCV: 86.9 fL (ref 80.0–100.0)
Platelets: 315 K/uL (ref 150–400)
RBC: 2.74 MIL/uL — ABNORMAL LOW (ref 3.87–5.11)
RDW: 16.9 % — ABNORMAL HIGH (ref 11.5–15.5)
WBC: 18.9 K/uL — ABNORMAL HIGH (ref 4.0–10.5)
nRBC: 0.8 % — ABNORMAL HIGH (ref 0.0–0.2)

## 2024-08-22 LAB — ABO/RH: ABO/RH(D): O NEG

## 2024-08-22 LAB — LACTIC ACID, PLASMA
Lactic Acid, Venous: 3.2 mmol/L (ref 0.5–1.9)
Lactic Acid, Venous: 3.5 mmol/L (ref 0.5–1.9)
Lactic Acid, Venous: 0.9 mmol/L (ref 0.5–1.9)

## 2024-08-22 MED ORDER — SODIUM CHLORIDE 0.9 % IV SOLN: 10.0000 mL/h | Freq: Once | INTRAVENOUS | Status: DC

## 2024-08-22 MED ORDER — SODIUM CHLORIDE 0.9 % IV SOLN
2.0000 g | Freq: Two times a day (BID) | INTRAVENOUS | Status: DC
  Administered 2024-08-23 – 2024-08-24 (×3): 2 g via INTRAVENOUS
  Filled 2024-08-22 (×4): qty 12.5

## 2024-08-22 MED ORDER — OXYCODONE HCL 5 MG PO TABS
5.0000 mg | ORAL_TABLET | ORAL | Status: DC | PRN
  Administered 2024-08-22: 20:00:00 5 mg via ORAL
  Filled 2024-08-22 (×2): qty 1

## 2024-08-22 MED ORDER — MELATONIN 5 MG PO TABS
2.5000 mg | ORAL_TABLET | Freq: Every day | ORAL | Status: DC
  Administered 2024-08-22 – 2024-08-23 (×2): 2.5 mg via ORAL
  Filled 2024-08-22 (×3): qty 1

## 2024-08-22 MED ORDER — LACTATED RINGERS IV BOLUS
1000.0000 mL | Freq: Once | INTRAVENOUS | Status: AC
  Administered 2024-08-22: 14:00:00 1000 mL via INTRAVENOUS

## 2024-08-22 MED ORDER — MORPHINE SULFATE (PF) 2 MG/ML IV SOLN: 2.0000 mg | INTRAVENOUS | Status: DC | PRN

## 2024-08-22 MED ORDER — ACETAMINOPHEN 325 MG PO TABS
650.0000 mg | ORAL_TABLET | Freq: Four times a day (QID) | ORAL | Status: DC | PRN
  Administered 2024-08-22: 22:00:00 650 mg via ORAL
  Filled 2024-08-22: qty 2

## 2024-08-22 MED ORDER — SODIUM CHLORIDE 0.9 % IV SOLN: INTRAVENOUS | Status: AC

## 2024-08-22 MED ORDER — SODIUM CHLORIDE 0.9 % IV SOLN
2.0000 g | Freq: Once | INTRAVENOUS | Status: AC
  Administered 2024-08-22: 15:00:00 2 g via INTRAVENOUS
  Filled 2024-08-22: qty 12.5

## 2024-08-22 MED ORDER — POLYETHYLENE GLYCOL 3350 17 G PO PACK: 17.0000 g | PACK | Freq: Every day | ORAL | Status: DC | PRN

## 2024-08-22 MED ORDER — ONDANSETRON HCL 4 MG/2ML IJ SOLN: 4.0000 mg | Freq: Four times a day (QID) | INTRAMUSCULAR | Status: DC | PRN

## 2024-08-22 MED ORDER — IOHEXOL 300 MG/ML  SOLN
100.0000 mL | Freq: Once | INTRAMUSCULAR | Status: AC | PRN
  Administered 2024-08-22: 16:00:00 100 mL via INTRAVENOUS

## 2024-08-22 MED ORDER — ONDANSETRON HCL 4 MG PO TABS: 4.0000 mg | ORAL_TABLET | Freq: Four times a day (QID) | ORAL | Status: DC | PRN

## 2024-08-22 MED ORDER — ACETAMINOPHEN 650 MG RE SUPP: 650.0000 mg | Freq: Four times a day (QID) | RECTAL | Status: DC | PRN

## 2024-08-22 NOTE — ED Triage Notes (Signed)
 Pt reports urinary sx's since November 22nd including hematuria, dysuria, and frequency. Pt states that she has generalized weakness and fatigue and has not been sleeping. Pt's HR at PCP was in the 140's. Pt's HR at time of triage 114. Pt is not currently on an antibiotic for same.

## 2024-08-22 NOTE — H&P (Signed)
 History and Physical    Cindy Finley FMW:[id number] DOB: [birth year] DOA: 08/22/2024  DOS: the patient was seen and examined on 08/22/2024  PCP: [name], MD   Patient coming from: Home  I have personally briefly reviewed patient's old medical records in [facility]  Chief Complaint: Blood in the urine since July 07, 2024  HPI: Cindy Finley is a pleasant 83 y.o. female with medical history significant for HLD, history of colon cancer s/p surgery and chemoradiation in 2001 who presented to ED from urology clinic today with 6 weeks of gross hematuria and reported recurrent UTIs.  Patient stated that she had first symptoms on November 1 and her primary care provider gave her Keflex for 5 days with improvement in her hematuria and dysuria.  After 3 weeks of completing the treatment she again had similar symptoms and called her primary care provider who advised to see urology as there is a recurrent urinary tract infection and there is something going on.  Urology appointment was today and urologist send her to emergency room for hematuria.  She denies any fever, chills, nausea, vomiting but she is feeling tired and not able to have energy to do things.  ED Course: Upon arrival to the ED, patient is found to be anemic with a hemoglobin of 5.5 and hematocrit of 17.2, leukocytosis to 20K with severe hematuria.  She was started on IV fluid, IV antibiotics and type and screen.  She will be transfused 2 units of PRBC.  Urology was consulted who advised to admit the patient under hospitalist service and hospital service was consulted for evaluation for admission.  Review of Systems:  ROS  All other systems negative except as noted in the HPI.  Past Medical History:  Diagnosis Date   Basal cell carcinoma 10/30/2020   R buccal cheek    Colon cancer ([facility]) 2001   chemo and radiation   Personal history of chemotherapy    colon ca   Personal history of radiation therapy    colon ca     Past Surgical History:  Procedure Laterality Date   BREAST BIOPSY Right 02/14/2017   benign     reports that she has quit smoking. She has never used smokeless tobacco. No history on file for alcohol use and drug use.  Allergies[1]  Family History  Problem Relation Age of Onset   Breast cancer Sister 8    Prior to Admission medications  Medication Sig Start Date End Date Taking? Authorizing Provider  aspirin 81 MG EC tablet Take by mouth.    [provider]  b complex vitamins capsule Take 1 capsule by mouth daily.    [provider]  Calcium Carbonate-Vitamin D 600-400 MG-UNIT tablet Take by mouth.    [provider]  Cholecalciferol 25 MCG (1000 UT) tablet Take by mouth.    [provider]  Cinnamon 500 MG capsule Take by mouth.    [provider]  melatonin 3 MG TABS tablet Take by mouth.    [provider]  Multiple Vitamin (MULTI-VITAMIN) tablet Take 1 tablet by mouth daily.    [provider]  Omega-3 Fatty Acids (FISH OIL) 1000 MG CAPS Take by mouth.    [provider]  simvastatin (ZOCOR) 10 MG tablet  07/29/20   [provider]  vitamin E 180 MG (400 UNITS) capsule Take by mouth.    [provider]    Physical Exam: Vitals:   08/22/24 1153 08/22/24  1156  BP: (!) 153/66   Pulse: (!) 114   Resp: 18   Temp: 98.1 F (36.7 C)   TempSrc: Oral   SpO2: 100%   Weight:  55.3 kg  Height:  5' 2 (1.575 m)    Physical Exam   Constitutional: Alert, awake, calm, comfortable HEENT: Neck supple Respiratory: Clear to auscultation B/L, no wheezing, no rales.  Cardiovascular: Regular rate and rhythm, no murmurs / rubs / gallops. No extremity edema. 2+ pedal pulses. No carotid bruits.  Abdomen: Soft, no tenderness, Bowel sounds positive.  Foley catheter is in with light hematuria Musculoskeletal: no clubbing / cyanosis. Good ROM, no contractures. Normal muscle tone.  Skin: no  rashes, lesions, ulcers. Neurologic: CN 2-12 grossly intact. Sensation intact, No focal deficit identified Psychiatric: Alert and oriented x 3. Normal mood.    Labs on Admission: I have personally reviewed following labs and imaging studies  CBC: Recent Labs  Lab 08/22/24 1158  WBC 20.1*  NEUTROABS 17.0*  HGB 5.5*  HCT 17.2*  MCV 95.6  PLT 469*   Basic Metabolic Panel: Recent Labs  Lab 08/22/24 1158  NA 135  K 3.9  CL 99  CO2 20*  GLUCOSE 131*  BUN 41*  CREATININE 1.02*  CALCIUM 9.2   GFR: Estimated Creatinine Clearance: 32.5 mL/min (A) (by C-G formula based on SCr of 1.02 mg/dL (H)). Liver Function Tests: Recent Labs  Lab 08/22/24 1158  AST 49*  ALT 24  ALKPHOS 65  BILITOT 0.4  PROT 6.6  ALBUMIN 3.9   No results for input(s): LIPASE, AMYLASE in the last 168 hours. No results for input(s): AMMONIA in the last 168 hours. Coagulation Profile: No results for input(s): INR, PROTIME in the last 168 hours. Cardiac Enzymes: No results for input(s): CKTOTAL, CKMB, CKMBINDEX, TROPONINI, TROPONINIHS in the last 168 hours. BNP (last 3 results) No results for input(s): BNP in the last 8760 hours. HbA1C: No results for input(s): HGBA1C in the last 72 hours. CBG: No results for input(s): GLUCAP in the last 168 hours. Lipid Profile: No results for input(s): CHOL, HDL, LDLCALC, TRIG, CHOLHDL, LDLDIRECT in the last 72 hours. Thyroid Function Tests: No results for input(s): TSH, T4TOTAL, FREET4, T3FREE, THYROIDAB in the last 72 hours. Anemia Panel: No results for input(s): VITAMINB12, FOLATE, FERRITIN, TIBC, IRON, RETICCTPCT in the last 72 hours. Urine analysis:    Component Value Date/Time   COLORURINE YELLOW (A) 08/22/2024 1158   APPEARANCEUR TURBID (A) 08/22/2024 1158   LABSPEC 1.018 08/22/2024 1158   PHURINE 8.0 08/22/2024 1158   GLUCOSEU NEGATIVE 08/22/2024 1158   HGBUR MODERATE (A) 08/22/2024 1158    BILIRUBINUR NEGATIVE 08/22/2024 1158   KETONESUR 5 (A) 08/22/2024 1158   PROTEINUR 100 (A) 08/22/2024 1158   NITRITE NEGATIVE 08/22/2024 1158   LEUKOCYTESUR NEGATIVE 08/22/2024 1158    Radiological Exams on Admission: I have personally reviewed images No results found.  EKG: My personal interpretation of EKG shows: Sinus tachycardia at 112 bpm no ST elevation.    Assessment/Plan Principal Problem:   Acute hemorrhagic cystitis Active Problems:   Gross hematuria   Hyperlipidemia   History of colon cancer    Assessment and Plan: 83 year old female double/PMH of colon cancer s/p surgery, chemoradiation, basal cell skin cancer, HLD, recurrent UTIs and hematuria started on 1 November came in from urology office for ongoing hematuria.  Patient was found to have severe anemia and ongoing hematuria.  1.  Recurrent UTI/sepsis present on admission - This could be complicated UTI. -  Given the history of colon cancer and chemo and radiation, there is suspicion for colovesical fistula per surgical team. - She will be placed in observation with IV antibiotic cefepime  that was started in the emergency room. - She also meets criteria for sepsis due to lactic acidosis, leukocytosis, tachycardia, source of infection UTI. - She was resuscitated with IV fluid in the emergency room and she will be given 75 cc/h normal saline maintenance fluids. - Will follow-up cultures.  2.  Complicated UTI with hematuria - As mentioned above urology evaluated patient and they suspect possible colovesical fistula. - Further plan per urology - Patient has a catheter and urinary bladder was irrigated.  Per urology there was a fecal material coming out. - CT hematuria is pending - Continue Foley catheter drainage - Further instruction per urology  3.  Severe acute blood loss anemia due to hematuria - Type and screen has been done - ED placed order for 2 units of PRBC. - I will transfuse those 2 units and check  hemoglobin and hematocrit. - I will transfuse her to maintain hemoglobin and hematocrit 7/21.  4.  Hyperlipidemia - Patient prefers not to take those medications while she is in the hospital     DVT prophylaxis: SCDs Code Status: DNR/DNI(Do NOT Intubate) Family Communication: None Disposition Plan: Home Consults called: Urology Admission status: Observation, Telemetry bed   Nena Rebel, MD [facility] 08/22/2024, 4:11 PM        [1] No Known Allergies

## 2024-08-22 NOTE — ED Notes (Signed)
 Bladder scan resulted 456 ml in the bladder.

## 2024-08-22 NOTE — ED Notes (Signed)
 Pt is seated on the toilet at this time, states that she felt the urge to go to the bathroom but is unsure if she is going to do anything

## 2024-08-22 NOTE — Sepsis Progress Note (Signed)
 Sent bedside RN a secure chat to remind lab to come draw the repeat lactic acid.

## 2024-08-22 NOTE — Consult Note (Signed)
 Urology Consult   I have been asked to see the patient by Dr. Willo, for evaluation and management of gross hematuria and anemia.  Chief Complaint: Gross hematuria  HPI:  Cindy Finley is a 83 y.o. female with history of colon cancer treated with surgery and chemoradiation in 2001 who actually presented to urology clinic today with 6 weeks of gross hematuria and reported recurrent UTIs.  She was tachycardic and appeared ill and was sent to the ER.  In the ER she was noted to have leukocytosis to 20K, new anemia with hematocrit of 17 from prior value of 39 June 2025.  She denies any abdominal pain or back pain.  Reportedly has had gross hematuria and clots intermittently over the last 6 weeks.  She did think her hematuria improved for a few days after being treated with antibiotics.  She denies any fevers or chills.  She is not on any anticoagulation, denies any significant weight loss.  PMH: Past Medical History:  Diagnosis Date   Basal cell carcinoma 10/30/2020   R buccal cheek    Colon cancer (HCC) 2001   chemo and radiation   Personal history of chemotherapy    colon ca   Personal history of radiation therapy    colon ca    Surgical History: Past Surgical History:  Procedure Laterality Date   BREAST BIOPSY Right 02/14/2017   benign     Allergies: Allergies[1]  Family History: Family History  Problem Relation Age of Onset   Breast cancer Sister 60    Social History:  reports that she has quit smoking. She has never used smokeless tobacco. No history on file for alcohol use and drug use.  ROS: Negative aside from those stated in the HPI.  Physical Exam: BP (!) 153/66   Pulse (!) 114   Temp 98.1 F (36.7 C) (Oral)   Resp 18   Ht 5' 2 (1.575 m)   Wt 55.3 kg   SpO2 100%   BMI 22.31 kg/m    Constitutional:  Alert and oriented, No acute distress. Cardiovascular: No clubbing, cyanosis, or edema. Respiratory: Normal respiratory effort, no increased work of  breathing. GI: Abdomen is soft, nontender, nondistended, no abdominal masses Foley with feculent urine, no significant hematuria  Laboratory Data: Reviewed in epic, see HPI  Pertinent Imaging: CT ordered and still pending  Procedure: Patient was prepped and draped in standard sterile fashion.  There were no external lesions at the urethral meatus.  A 24 French three-way Roush hematuria catheter advanced easily into the bladder with return of brown feculent urine.  Approximately 400 mL brown feculent urine were irrigated free.  The catheter was irrigated with 1.5 L of normal saline and cleared, minimal return of old clot less than 10ml.  The inflow was plugged and catheter was connected to dependent drainage.  Assessment & Plan:   83 year old female with history of colon cancer treated with surgery and chemoradiation in 2001 who presents with 6 weeks of gross hematuria and clots.  No other significant symptoms.  Significant anemia with hematocrit of 17 from prior value of 30 13 February 2024.  Based on Foley placement with feculent urine high suspicion for colovesical fistula.  We discussed other possible etiologies including radiation cystitis, malignancy/recurrence of colon cancer/new bladder malignancy.  Recommendations:  - CT hematuria ordered - Okay to irrigate Foley by hand as needed.  No CBI unless initiated by urology. - Agree with broad-spectrum antibiotics, follow-up urine cultures -  Agree with hospitalist admission and blood transfusion - Urology will continue to follow  [name], MD  Total time spent on the floor was 60 minutes, with greater than 50% spent in counseling and coordination of care with the patient regarding gross hematuria, feculent urine, possible causes, need for further workup with CT urogram and admission for blood transfusion for significant anemia.  [facility] [address], [address] [place], [place] [address] [phone]      [1]  No Known Allergies

## 2024-08-22 NOTE — Progress Notes (Signed)
 08/22/2024 11:20 AM   Cindy Finley 06, 1942 [id number]  Referring provider: [name], MD [id number] [address] [facility] [place],  [place] [address]  Chief Complaint  Patient presents with   Hematuria    HPI: Cindy Finley is a 83 y.o. female referred for evaluation of gross hematuria.  States she was in good health and having no problems until early November when she had onset of gross hematuria associated with urinary frequency, urgency and dysuria.  Saw PCP and states she was started on Keflex x 5 days with resolution of her symptoms.  Urinalysis at that visit showed 10-50 RBCs and no significant WBCs.  Urine culture was negative and she was subsequently referred to urology. She had recurrent symptoms a few weeks later and complains of urinary frequency, urgency, dysuria with urge incontinence.  She has a weak urinary stream and difficulty voiding with intermittent gross hematuria including clots Denies previous history of urologic problems  PMH: Past Medical History:  Diagnosis Date   Basal cell carcinoma 10/30/2020   R buccal cheek    Colon cancer (HCC) 2001   chemo and radiation   Personal history of chemotherapy    colon ca   Personal history of radiation therapy    colon ca    Surgical History: Past Surgical History:  Procedure Laterality Date   BREAST BIOPSY Right 02/14/2017   benign    Home Medications:  Allergies as of 08/22/2024   No Known Allergies      Medication List        Accurate as of August 22, 2024 11:20 AM. If you have any questions, ask your nurse or doctor.          aspirin EC 81 MG tablet Take by mouth.   b complex vitamins capsule Take 1 capsule by mouth daily.   Calcium Carbonate-Vitamin D 600-400 MG-UNIT tablet Take by mouth.   Cholecalciferol 25 MCG (1000 UT) tablet Take by mouth.   Cinnamon 500 MG capsule Take by mouth.   Fish Oil 1000 MG Caps Take by mouth.    melatonin 3 MG Tabs tablet Take by mouth.   Multi-Vitamin tablet Take 1 tablet by mouth daily.   simvastatin 10 MG tablet Commonly known as: ZOCOR   vitamin E 180 MG (400 UNITS) capsule Take by mouth.        Allergies: Allergies[1]  Family History: Family History  Problem Relation Age of Onset   Breast cancer Sister 11    Social History:  reports that she has quit smoking. She has never used smokeless tobacco. No history on file for alcohol use and drug use.   Physical Exam: BP 128/66   Pulse (!) 147   Temp 98 F (36.7 C) (Oral)   Wt 160 lb 11.2 oz (72.9 kg)   SpO2 93%   Constitutional: Pale, No acute distress.  Pulse 147 as above HEENT: Agency AT Respiratory: Normal respiratory effort, no increased work of breathing.  Psychiatric: Normal mood and affect.  Laboratory Data:  Urinalysis Appearance: Brown/turbid Microscopy >30 WBC/>30 RBC   Assessment & Plan:    1.  Gross hematuria AUA risk stratification: High We discussed the recommended evaluation of high risk hematuria which consist of CT urogram and cystoscopy.  The procedures were discussed in detail and she has elected to proceed with further evaluation All questions were answered Discussed her elevated heart rate and she is very pale and recommended further evaluation in  the ED.  ED was contacted and she was transported for further evaluation   Glendia JAYSON Barba, MD  [facility] [address] [place], [place] [address] [phone]     [1] No Known Allergies

## 2024-08-22 NOTE — Plan of Care (Signed)
°  Problem: Education: Goal: Knowledge of General Education information will improve Description: Including pain rating scale, medication(s)/side effects and non-pharmacologic comfort measures Outcome: Progressing   Problem: Clinical Measurements: Goal: Ability to maintain clinical measurements within normal limits will improve Outcome: Progressing Goal: Cardiovascular complication will be avoided Outcome: Progressing   Problem: Activity: Goal: Risk for activity intolerance will decrease Outcome: Progressing   Problem: Nutrition: Goal: Adequate nutrition will be maintained Outcome: Progressing   Problem: Coping: Goal: Level of anxiety will decrease Outcome: Progressing   Problem: Elimination: Goal: Will not experience complications related to urinary retention Outcome: Progressing   Problem: Pain Managment: Goal: General experience of comfort will improve and/or be controlled Outcome: Progressing   Problem: Safety: Goal: Ability to remain free from injury will improve Outcome: Progressing   Problem: Skin Integrity: Goal: Risk for impaired skin integrity will decrease Outcome: Progressing   Problem: Clinical Measurements: Goal: Respiratory complications will improve Outcome: Not Applicable

## 2024-08-22 NOTE — ED Notes (Signed)
 Dr Willo notified of lactic 3.2. orders to be placed as needed.

## 2024-08-22 NOTE — ED Triage Notes (Signed)
 First nurse note: pt to ED from Dr Twylla for tachycardia, diaphoresis, pale in color. Was being seen for recurrent UTI, hematuria.

## 2024-08-22 NOTE — Sepsis Progress Note (Signed)
 Code Sepsis protocol being monitored by eLink.

## 2024-08-22 NOTE — Progress Notes (Signed)
 CODE SEPSIS - PHARMACY COMMUNICATION  **Broad Spectrum Antibiotics should be administered within 1 hour of Sepsis diagnosis**  Time Code Sepsis Called/Page Received: 13:23  Antibiotics Ordered: Cefepime   Time of 1st antibiotic administration: 14:51  Additional action taken by pharmacy: Messaged RN whether or not Cefepime  was started. RN said they would get it started as soon as they are able to  If necessary, Name of Provider/Nurse Contacted: Penne DELENA Bruckner, RN    Ransom Blanch PGY-1 Pharmacy Resident  [facility]  08/22/2024 1:33 PM

## 2024-08-22 NOTE — ED Provider Notes (Signed)
 [facility] Provider Note    Event Date/Time   First MD Initiated Contact with Patient 08/22/24 1309     (approximate)   History   Chief Complaint Tachycardia   HPI  Cindy Finley is a 83 y.o. female with past medical history of hyperlipidemia who presents to the ED complaining of tachycardia.  Patient reports that she was initially treated for a UTI completing in November, completed a course of Keflex and felt better.  She then had return of dysuria and hematuria at the end of November, was referred to urology by her PCP at that time.  She states that she has had near continuous dysuria over the past couple of weeks and has passed significant amount of blood clots in her urine.  She reports occasionally having difficulty passing urine, at which point she will have a small gush of blood and then urine seems to flow again.  She denies any fevers, abdominal pain, or flank pain.  When she presented to the urologist office today, she was found to be tachycardic and pale, referred to the ED for evaluation.  She does not take any blood thinners.     Physical Exam   Triage Vital Signs: ED Triage Vitals  Encounter Vitals Group     BP 08/22/24 1153 (!) 153/66     Girls Systolic BP Percentile --      Girls Diastolic BP Percentile --      Boys Systolic BP Percentile --      Boys Diastolic BP Percentile --      Pulse Rate 08/22/24 1153 (!) 114     Resp 08/22/24 1153 18     Temp 08/22/24 1153 98.1 F (36.7 C)     Temp Source 08/22/24 1153 Oral     SpO2 08/22/24 1153 100 %     Weight 08/22/24 1156 122 lb (55.3 kg)     Height 08/22/24 1156 5' 2 (1.575 m)     Head Circumference --      Peak Flow --      Pain Score 08/22/24 1154 10     Pain Loc --      Pain Education --      Exclude from Growth Chart --     Most recent vital signs: Vitals:   08/22/24 1153  BP: (!) 153/66  Pulse: (!) 114  Resp: 18  Temp: 98.1 F (36.7 C)  SpO2: 100%    Constitutional:  Alert and oriented. Eyes: Conjunctivae are normal. Head: Atraumatic. Nose: No congestion/rhinnorhea. Mouth/Throat: Mucous membranes are moist. Cardiovascular: Tachycardic, regular rhythm. Grossly normal heart sounds.  2+ radial pulses bilaterally. Respiratory: Normal respiratory effort.  No retractions. Lungs CTAB. Gastrointestinal: Soft and nontender.  No CVA tenderness bilaterally.  No distention. Musculoskeletal: No lower extremity tenderness nor edema.  Neurologic:  Normal speech and language. No gross focal neurologic deficits are appreciated.    ED Results / Procedures / Treatments   Labs (all labs ordered are listed, but only abnormal results are displayed) Labs Reviewed  LACTIC ACID, PLASMA - Abnormal; Notable for the following components:      Result Value   Lactic Acid, Venous 3.2 (*)    All other components within normal limits  COMPREHENSIVE METABOLIC PANEL WITH GFR - Abnormal; Notable for the following components:   CO2 20 (*)    Glucose, Bld 131 (*)    BUN 41 (*)    Creatinine, Ser 1.02 (*)    AST 49 (*)  GFR, Estimated 54 (*)    Anion gap 16 (*)    All other components within normal limits  CBC WITH DIFFERENTIAL/PLATELET - Abnormal; Notable for the following components:   WBC 20.1 (*)    RBC 1.80 (*)    Hemoglobin 5.5 (*)    HCT 17.2 (*)    Platelets 469 (*)    nRBC 0.3 (*)    Neutro Abs 17.0 (*)    Monocytes Absolute 2.0 (*)    Abs Immature Granulocytes 0.16 (*)    All other components within normal limits  URINALYSIS, W/ REFLEX TO CULTURE (INFECTION SUSPECTED) - Abnormal; Notable for the following components:   Color, Urine YELLOW (*)    APPearance TURBID (*)    Hgb urine dipstick MODERATE (*)    Ketones, ur 5 (*)    Protein, ur 100 (*)    Bacteria, UA MANY (*)    All other components within normal limits  CULTURE, BLOOD (ROUTINE X 2)  CULTURE, BLOOD (ROUTINE X 2)  URINE CULTURE  LACTIC ACID, PLASMA  PREPARE RBC (CROSSMATCH)  TYPE AND SCREEN      EKG  ED ECG REPORT I, Carlin Palin, the attending physician, personally viewed and interpreted this ECG.   Date: 08/22/2024  EKG Time: 11:54  Rate: 112  Rhythm: sinus tachycardia  Axis: Normal  Intervals:none  ST&T Change: None  PROCEDURES:  Critical Care performed: Yes, see critical care procedure note(s)  .Critical Care  Performed by: Palin Carlin, MD Authorized by: Palin Carlin, MD   Critical care provider statement:    Critical care time (minutes):  30   Critical care time was exclusive of:  Separately billable procedures and treating other patients and teaching time   Critical care was necessary to treat or prevent imminent or life-threatening deterioration of the following conditions:  Sepsis (Anemia)   Critical care was time spent personally by me on the following activities:  Development of treatment plan with patient or surrogate, discussions with consultants, evaluation of patient's response to treatment, examination of patient, ordering and review of laboratory studies, ordering and review of radiographic studies, ordering and performing treatments and interventions, pulse oximetry, re-evaluation of patient's condition and review of old charts   I assumed direction of critical care for this patient from another provider in my specialty: no     Care discussed with: admitting provider      MEDICATIONS ORDERED IN ED: Medications  ceFEPIme  (MAXIPIME ) 2 g in sodium chloride  0.9 % 100 mL IVPB (2 g Intravenous New Bag/Given 08/22/24 1451)  0.9 %  sodium chloride  infusion (0 mL/hr Intravenous Hold 08/22/24 1451)  lactated ringers  bolus 1,000 mL (1,000 mLs Intravenous New Bag/Given 08/22/24 1355)     IMPRESSION / MDM / ASSESSMENT AND PLAN / ED COURSE  I reviewed the triage vital signs and the nursing notes.                              83 y.o. female with past medical history of hyperlipidemia who presents to the ED complaining of about 2 weeks of dysuria  and hematuria with general malaise and weakness.  Patient's presentation is most consistent with acute presentation with potential threat to life or bodily function.  Differential diagnosis includes, but is not limited to, sepsis, UTI, pyelonephritis, kidney stone, AKI, electrolyte abnormality, anemia.  Patient ill-appearing but in no acute distress, vital signs remarkable for tachycardia but otherwise reassuring.  Labs show  significant leukocytosis at 20 as well as elevation in lactic acid, concerning for sepsis secondary to UTI.  Urine consistent with infection, was sent for culture and we will treat with IV cefepime .  Patient with mild AKI but no acute electrolyte abnormality and LFTs are unremarkable.  She does have significant anemia with hemoglobin of 5.5, likely due to her 2 weeks of hematuria.  We will transfuse 2 units of PRBCs, check bladder scan to ensure no significant urinary retention.  Bladder scan shows greater than 400 cc of urine and patient reports difficulty emptying her bladder.  Case discussed with Dr. Francisca of urology, who will come to the ED to three-way catheter for irrigation, also recommends obtaining CT urogram.  Case discussed with hospitalist for admission.      FINAL CLINICAL IMPRESSION(S) / ED DIAGNOSES   Final diagnoses:  Sepsis without acute organ dysfunction, due to unspecified organism [facility])  Acute cystitis without hematuria  Anemia, unspecified type  Gross hematuria     Rx / DC Orders   ED Discharge Orders     None        Note:  This document was prepared using Dragon voice recognition software and may include unintentional dictation errors.   Willo Dunnings, MD 08/22/24 1524

## 2024-08-22 NOTE — Sepsis Progress Note (Signed)
 Notified provider of need to consider order for repeat LA since it is trending up.

## 2024-08-22 NOTE — ED Notes (Signed)
 NURSE BRANDON INFORMED OF BED ASSIGNED

## 2024-08-23 DIAGNOSIS — R82998 Other abnormal findings in urine: Secondary | ICD-10-CM

## 2024-08-23 DIAGNOSIS — Z85828 Personal history of other malignant neoplasm of skin: Secondary | ICD-10-CM | POA: Diagnosis not present

## 2024-08-23 DIAGNOSIS — Z923 Personal history of irradiation: Secondary | ICD-10-CM | POA: Diagnosis not present

## 2024-08-23 DIAGNOSIS — B952 Enterococcus as the cause of diseases classified elsewhere: Secondary | ICD-10-CM | POA: Diagnosis present

## 2024-08-23 DIAGNOSIS — A419 Sepsis, unspecified organism: Secondary | ICD-10-CM | POA: Diagnosis present

## 2024-08-23 DIAGNOSIS — Z7982 Long term (current) use of aspirin: Secondary | ICD-10-CM | POA: Diagnosis not present

## 2024-08-23 DIAGNOSIS — E785 Hyperlipidemia, unspecified: Secondary | ICD-10-CM | POA: Diagnosis present

## 2024-08-23 DIAGNOSIS — N3001 Acute cystitis with hematuria: Secondary | ICD-10-CM | POA: Diagnosis present

## 2024-08-23 DIAGNOSIS — Z85038 Personal history of other malignant neoplasm of large intestine: Secondary | ICD-10-CM | POA: Diagnosis not present

## 2024-08-23 DIAGNOSIS — N321 Vesicointestinal fistula: Secondary | ICD-10-CM | POA: Diagnosis present

## 2024-08-23 DIAGNOSIS — E872 Acidosis, unspecified: Secondary | ICD-10-CM | POA: Diagnosis present

## 2024-08-23 DIAGNOSIS — Z87891 Personal history of nicotine dependence: Secondary | ICD-10-CM | POA: Diagnosis not present

## 2024-08-23 DIAGNOSIS — Z803 Family history of malignant neoplasm of breast: Secondary | ICD-10-CM | POA: Diagnosis not present

## 2024-08-23 DIAGNOSIS — Z9221 Personal history of antineoplastic chemotherapy: Secondary | ICD-10-CM | POA: Diagnosis not present

## 2024-08-23 DIAGNOSIS — N390 Urinary tract infection, site not specified: Secondary | ICD-10-CM | POA: Diagnosis not present

## 2024-08-23 DIAGNOSIS — Z87440 Personal history of urinary (tract) infections: Secondary | ICD-10-CM | POA: Diagnosis not present

## 2024-08-23 DIAGNOSIS — Z79899 Other long term (current) drug therapy: Secondary | ICD-10-CM | POA: Diagnosis not present

## 2024-08-23 DIAGNOSIS — D62 Acute posthemorrhagic anemia: Secondary | ICD-10-CM | POA: Diagnosis present

## 2024-08-23 DIAGNOSIS — Z66 Do not resuscitate: Secondary | ICD-10-CM | POA: Diagnosis present

## 2024-08-23 DIAGNOSIS — D649 Anemia, unspecified: Secondary | ICD-10-CM | POA: Diagnosis not present

## 2024-08-23 LAB — COMPREHENSIVE METABOLIC PANEL WITH GFR
ALT: 29 U/L (ref 0–44)
AST: 71 U/L — ABNORMAL HIGH (ref 15–41)
Albumin: 3 g/dL — ABNORMAL LOW (ref 3.5–5.0)
Alkaline Phosphatase: 58 U/L (ref 38–126)
Anion gap: 9 (ref 5–15)
BUN: 21 mg/dL (ref 8–23)
CO2: 23 mmol/L (ref 22–32)
Calcium: 7.9 mg/dL — ABNORMAL LOW (ref 8.9–10.3)
Chloride: 106 mmol/L (ref 98–111)
Creatinine, Ser: 0.7 mg/dL (ref 0.44–1.00)
GFR, Estimated: >60 mL/min (ref >60)
Glucose, Bld: 96 mg/dL (ref 70–99)
Potassium: 3.7 mmol/L (ref 3.5–5.1)
Sodium: 137 mmol/L (ref 135–145)
Total Bilirubin: 1 mg/dL (ref 0.0–1.2)
Total Protein: 5.4 g/dL — ABNORMAL LOW (ref 6.5–8.1)

## 2024-08-23 LAB — TYPE AND SCREEN
ABO/RH(D): O NEG
Antibody Screen: NEGATIVE
Unit division: 0
Unit division: 0

## 2024-08-23 LAB — PROTIME-INR
INR: 1.2 (ref 0.8–1.2)
Prothrombin Time: 15.5 s — ABNORMAL HIGH (ref 11.4–15.2)

## 2024-08-23 LAB — MICROSCOPIC EXAMINATION
RBC, Urine: >30 /HPF — AB (ref 0–2)
WBC, UA: >30 /HPF — AB (ref 0–5)

## 2024-08-23 LAB — CBC
HCT: 25 % — ABNORMAL LOW (ref 36.0–46.0)
HCT: 27 % — ABNORMAL LOW (ref 36.0–46.0)
Hemoglobin: 8.4 g/dL — ABNORMAL LOW (ref 12.0–15.0)
Hemoglobin: 9.3 g/dL — ABNORMAL LOW (ref 12.0–15.0)
MCH: 29.6 pg (ref 26.0–34.0)
MCH: 29.4 pg (ref 26.0–34.0)
MCHC: 33.6 g/dL (ref 30.0–36.0)
MCHC: 34.4 g/dL (ref 30.0–36.0)
MCV: 88 fL (ref 80.0–100.0)
MCV: 85.4 fL (ref 80.0–100.0)
Platelets: 301 K/uL (ref 150–400)
Platelets: 310 K/uL (ref 150–400)
RBC: 2.84 MIL/uL — ABNORMAL LOW (ref 3.87–5.11)
RBC: 3.16 MIL/uL — ABNORMAL LOW (ref 3.87–5.11)
RDW: 18.6 % — ABNORMAL HIGH (ref 11.5–15.5)
RDW: 18.8 % — ABNORMAL HIGH (ref 11.5–15.5)
WBC: 17.6 K/uL — ABNORMAL HIGH (ref 4.0–10.5)
WBC: 20.4 K/uL — ABNORMAL HIGH (ref 4.0–10.5)
nRBC: 1.1 % — ABNORMAL HIGH (ref 0.0–0.2)
nRBC: 0.6 % — ABNORMAL HIGH (ref 0.0–0.2)

## 2024-08-23 LAB — URINALYSIS, COMPLETE

## 2024-08-23 LAB — BPAM RBC
Blood Product Expiration Date: 202512292359
Blood Product Expiration Date: 202512302359
ISSUE DATE / TIME: 202512171724
ISSUE DATE / TIME: 202512172036
Unit Type and Rh: 9500
Unit Type and Rh: 9500

## 2024-08-23 LAB — PREPARE RBC (CROSSMATCH)

## 2024-08-23 LAB — LACTIC ACID, PLASMA: Lactic Acid, Venous: 0.7 mmol/L (ref 0.5–1.9)

## 2024-08-23 MED ORDER — CHLORHEXIDINE GLUCONATE CLOTH 2 % EX PADS
6.0000 | MEDICATED_PAD | Freq: Every day | CUTANEOUS | Status: DC
  Administered 2024-08-23 – 2024-08-24 (×2): 6 via TOPICAL

## 2024-08-23 NOTE — Care Management Obs Status (Signed)
 MEDICARE OBSERVATION STATUS NOTIFICATION   Patient Details  Name: PALESTINE MOSCO MRN: [id number] Date of Birth: 04/03/1941   Medicare Observation Status Notification Given:  Yes    Rojelio SHAUNNA Rattler 08/23/2024, 1:36 PM

## 2024-08-23 NOTE — TOC CM/SW Note (Signed)
 Transition of Care [facility]) - Inpatient Brief Assessment   Patient Details  Name: Cindy Finley MRN: [id number] Date of Birth: [birth year]  [facility] CM/SW Contact:    Corean ONEIDA Haddock, RN Phone Number: 08/23/2024, 2:54 PM   Clinical Narrative: Transition of Care Department [facility]) has reviewed patient and no TOC needs have been identified at this time.  If new patient transition needs arise, please place a TOC consult.    Transition of Care Asessment: Insurance and Status: Insurance coverage has been reviewed Patient has primary care physician: Yes     Prior/Current Home Services: No current home services Social Drivers of Health Review: SDOH reviewed no interventions necessary Readmission risk has been reviewed: Yes Transition of care needs: no transition of care needs at this time

## 2024-08-23 NOTE — Progress Notes (Signed)
 PROGRESS NOTE    Cindy Finley  FMW:[id number] DOB: Sep 02, 1941 DOA: 08/22/2024 PCP: [name], MD   Assessment & Plan:   Principal Problem:   Acute hemorrhagic cystitis Active Problems:   Gross hematuria   Hyperlipidemia   History of colon cancer  Assessment and Plan: Sepsis: met criteria w/ leukocytosis, tachycardia, lactic acidosis and UTI. Continue on IV cefepime     Complicated UTI: w/ hematuria. Continue w/ foley as per uro. Continue on IV cefepime . Possible colovesical fistula. Per urology there was a fecal material coming out. CT hematuria shows colovesical fistula was not directly demonstrated on today's examination, consider cystogram or limited barium enema. Urology following and recs apprec. Hx of colon cancer in 2021 but has since been in remission as per pt    Acute blood loss anemia: possibly secondary to hematuria. S/p 2 units of pRBCs transfused so far    HLD: holding home dose of statin        DVT prophylaxis: SCDs Code Status: DNR Family Communication:  Disposition Plan: likely d/c back home  Level of care: Telemetry  Status is: Observation The patient remains OBS appropriate and will d/c before 2 midnights.    Consultants:    Procedures:   Antimicrobials: cefepime     Subjective: Pt c/o discomfort from the foley.  Objective: Vitals:   08/22/24 2058 08/22/24 2216 08/23/24 0346 08/23/24 0914  BP: (!) 113/56 (!) 127/50 (!) 108/59 (!) 118/53  Pulse: (!) 105 96 86 88  Resp: 16 16 14 16   Temp: 98.3 F (36.8 C) 99 F (37.2 C) 98.7 F (37.1 C) 98.3 F (36.8 C)  TempSrc: Oral Oral Oral Oral  SpO2: 97% 95% 97% 97%  Weight:      Height:        Intake/Output Summary (Last 24 hours) at 08/23/2024 1014 Last data filed at 08/23/2024 0600 Gross per 24 hour  Intake 1722.01 ml  Output 750 ml  Net 972.01 ml   Filed Weights   08/22/24 1156  Weight: 55.3 kg    Examination:  General exam: Appears calm and comfortable  Respiratory  system: Clear to auscultation. Respiratory effort normal. Cardiovascular system: S1 & S2+. No rubs, gallops or clicks.  Gastrointestinal system: Abdomen is nondistended, soft and nontender. Normal bowel sounds heard. Central nervous system: Alert and oriented. Moves all extremities Psychiatry: Judgement and insight appear normal. Flat mood and affect    Data Reviewed: I have personally reviewed following labs and imaging studies  CBC: Recent Labs  Lab 08/22/24 1158 08/22/24 2322 08/23/24 0907  WBC 20.1* 18.9* 17.6*  NEUTROABS 17.0*  --   --   HGB 5.5* 8.1* 8.4*  HCT 17.2* 23.8* 25.0*  MCV 95.6 86.9 88.0  PLT 469* 315 301   Basic Metabolic Panel: Recent Labs  Lab 08/22/24 1158  NA 135  K 3.9  CL 99  CO2 20*  GLUCOSE 131*  BUN 41*  CREATININE 1.02*  CALCIUM 9.2   GFR: Estimated Creatinine Clearance: 32.5 mL/min (A) (by C-G formula based on SCr of 1.02 mg/dL (H)). Liver Function Tests: Recent Labs  Lab 08/22/24 1158  AST 49*  ALT 24  ALKPHOS 65  BILITOT 0.4  PROT 6.6  ALBUMIN 3.9   No results for input(s): LIPASE, AMYLASE in the last 168 hours. No results for input(s): AMMONIA in the last 168 hours. Coagulation Profile: Recent Labs  Lab 08/23/24 0907  INR 1.2   Cardiac Enzymes: No results for input(s): CKTOTAL, CKMB, CKMBINDEX, TROPONINI in the last  168 hours. BNP (last 3 results) No results for input(s): PROBNP in the last 8760 hours. HbA1C: No results for input(s): HGBA1C in the last 72 hours. CBG: No results for input(s): GLUCAP in the last 168 hours. Lipid Profile: No results for input(s): CHOL, HDL, LDLCALC, TRIG, CHOLHDL, LDLDIRECT in the last 72 hours. Thyroid Function Tests: No results for input(s): TSH, T4TOTAL, FREET4, T3FREE, THYROIDAB in the last 72 hours. Anemia Panel: No results for input(s): VITAMINB12, FOLATE, FERRITIN, TIBC, IRON, RETICCTPCT in the last 72 hours. Sepsis  Labs: Recent Labs  Lab 08/22/24 1158 08/22/24 1358 08/22/24 2322 08/23/24 0221  LATICACIDVEN 3.2* 3.5* 0.9 0.7    Recent Results (from the past 240 hours)  Microscopic Examination     Status: Abnormal   Collection Time: 08/22/24 11:03 AM   Urine  Result Value Ref Range Status   WBC, UA >30 (A) 0 - 5 /hpf Final   RBC, Urine >30 (A) 0 - 2 /hpf Final   Epithelial Cells (non renal) 0-10 0 - 10 /hpf Final   Crystals Present (A) N/A Final   Crystal Type Amorphous Sediment N/A Final   Bacteria, UA Many (A) None seen/Few Final  Culture, blood (routine x 2)     Status: None (Preliminary result)   Collection Time: 08/22/24  2:46 PM   Specimen: BLOOD  Result Value Ref Range Status   Specimen Description BLOOD BLOOD LEFT FOREARM  Final   Special Requests   Final    BOTTLES DRAWN AEROBIC AND ANAEROBIC Blood Culture results may not be optimal due to an inadequate volume of blood received in culture bottles   Culture   Final    NO GROWTH < 24 HOURS Performed at [facility], [address], [place], [place] [address]    Report Status PENDING  Incomplete  Culture, blood (routine x 2)     Status: None (Preliminary result)   Collection Time: 08/22/24  2:46 PM   Specimen: BLOOD  Result Value Ref Range Status   Specimen Description BLOOD RIGHT ANTECUBITAL  Final   Special Requests   Final    BOTTLES DRAWN AEROBIC AND ANAEROBIC Blood Culture results may not be optimal due to an inadequate volume of blood received in culture bottles   Culture   Final    NO GROWTH < 24 HOURS Performed at [facility], [address], [place], [place] [address]    Report Status PENDING  Incomplete         Radiology Studies: CT HEMATURIA WORKUP Result Date: 08/22/2024 EXAM: CT UROGRAM 08/22/2024 04:28:14 PM TECHNIQUE: CT of the abdomen and pelvis was performed before and after the administration of intravenous contrast as per CT urogram protocol. Multiplanar reformatted images as well  as MIP urogram images are provided for review. Automated exposure control, iterative reconstruction, and/or weight based adjustment of the mA/kV was utilized to reduce the radiation dose to as low as reasonably achievable. CONTRAST: 100 cc omnipaque  300 COMPARISON: None available. CLINICAL HISTORY: Hematuria. FINDINGS: LOWER CHEST: 4 mm calcified granuloma in the left lower lobe on image 10 series 3. Several other small nodules in the 2 to 3 mm range in the lung bases are observed and there is a 5 x 4 mm right middle lobe nodule on image 14 series 4. According to the Fleischner Society pulmonary nodule recommendations, the finding is consistent with multiple solid nodules, with the largest nodule measuring 5 x 4 mm, and the recommendation is: for low-risk patients, no routine follow-up  is required; for high-risk patients, optional CT at 12 months. Low density blood pool compatible with anemia. Mild mitral valve calcification. Descending thoracic aortic atherosclerotic vascular calcification. Small posterior eventration of the right hemidiaphragm contains adipose tissue. LIVER: Relatively atrophic left hepatic lobe. GALLBLADDER AND BILE DUCTS: Gallbladder is unremarkable. No biliary ductal dilatation. SPLEEN: No acute abnormality. PANCREAS: No acute abnormality. ADRENAL GLANDS: No acute abnormality. KIDNEYS, URETERS AND BLADDER: Roush catheter is present in the urinary bladder with urinary bladder wall thickening, cystitis not excluded. Small amount of gas and fluid in the urinary bladder. No significant abnormal filling defect identified in the renal collecting systems or ureters. I don't see an obvious mass in the urinary bladder although portions of the urinary bladder are thicker than others (for example on the right on image 67 series 12). Reportedly head catheter placement there was feculent urine return concerning for colovesical fistula; the urinary bladder is in close proximity to the cecum and loops of  small bowel but a definite fistula is not identified. There is an anastomotic staple line in the rectum but the uterus is still present providing some separation of the urinary bladder from the postoperative portion of the rectum. Cystogram or limited barium enema could be utilized to further assess for fistula if clinically indicated. No stones in the kidneys or ureters. No hydronephrosis. No perinephric or periureteral stranding. GI AND BOWEL: Stomach demonstrates no acute abnormality. There is no bowel obstruction. Retrocecal soft tissue density is likely chronic and therapy related. There is an anastomotic staple line in the rectum but the uterus is still present providing some separation of the urinary bladder from the postoperative portion of the rectum. PERITONEUM AND RETROPERITONEUM: No ascites. No free air. VASCULATURE: Systemic atherosclerosis is present, including the aorta and iliac arteries. Atheromatous plaque dorsally at the origins of the celiac trunk and superior mesenteric artery. LYMPH NODES: No lymphadenopathy. REPRODUCTIVE ORGANS: The uterus is still present providing some separation of the urinary bladder from the postoperative portion of the rectum. BONES AND SOFT TISSUES: Sclerosis along the sacral ala adjacent to the sacroiliac joint, possibly degenerative or due to remote sacral insufficiency fracture. Grade 1 degenerative anterolisthesis at L3-L4 and L4-L5. No focal soft tissue abnormality. IMPRESSION: 1. 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 Further evaluation with cystogram or limited barium enema could be considered if clinically indicated. 2. Sclerosis along the sacral ala adjacent to the sacroiliac joint and grade 1 degenerative  anterolisthesis at L3-4 and L4-5. 3. Additional chronic and incidental findings include low-density blood pool compatible with anemia, systemic atherosclerosis, mild mitral valve calcification, small posterior right hemidiaphragm eventration, relative left hepatic lobe atrophy, chronic-appearing retrocecal soft tissue density, and small pulmonary nodules for which a follow-up CT at 12 months is optional for high-risk patients but not indicated for low-risk patients. Electronically signed by: Ryan Salvage MD 08/22/2024 05:14 PM EST RP Workstation: [id number]        Scheduled Meds:  melatonin  2.5 mg Oral QHS   Continuous Infusions:  sodium chloride  75 mL/hr at 08/23/24 0709   ceFEPime  (MAXIPIME ) IV Stopped (08/23/24 0228)     LOS: 0 days       Anthony CHRISTELLA Pouch, MD [facility] Pager [phone]  If 7PM-7AM, please contact night-coverage [email or web address] 08/23/2024, 10:14 AM

## 2024-08-23 NOTE — Progress Notes (Signed)
 Urology Inpatient Progress Note  Subjective: No acute events overnight. She is afebrile, VSS. She is s/p 2 units PRBCs.  AK-Tate normalized, 0.7.  White count down, 17.6.  Hemoglobin stably elevated, 8.4.  Creatinine normalized, 0.70. Urine culture growing E faecalis, susceptibilities to follow.  Blood cultures with no growth at <24 hours.  On antibiotics as below. Foley catheter in place draining clear, pink urine.  Feculent nonobstructing debris and small clot fragments noted along the tubing. She reports feeling much better today.  Anti-infectives: Anti-infectives (From admission, onward)    Start     Dose/Rate Route Frequency Ordered Stop   08/23/24 0200  ceFEPIme  (MAXIPIME ) 2 g in sodium chloride  0.9 % 100 mL IVPB        2 g 200 mL/hr over 30 Minutes Intravenous Every 12 hours 08/22/24 1610     08/22/24 1330  ceFEPIme  (MAXIPIME ) 2 g in sodium chloride  0.9 % 100 mL IVPB        2 g 200 mL/hr over 30 Minutes Intravenous  Once 08/22/24 1323 08/22/24 1521       Current Facility-Administered Medications  Medication Dose Route Frequency Provider Last Rate Last Admin   0.9 %  sodium chloride  infusion   Intravenous Continuous Paudel, Keshab, MD 75 mL/hr at 08/23/24 0709 New Bag at 08/23/24 0709   acetaminophen  (TYLENOL ) tablet 650 mg  650 mg Oral Q6H PRN Roann Gouty, MD   650 mg at 08/22/24 2224   Or   acetaminophen  (TYLENOL ) suppository 650 mg  650 mg Rectal Q6H PRN Roann Gouty, MD       ceFEPIme  (MAXIPIME ) 2 g in sodium chloride  0.9 % 100 mL IVPB  2 g Intravenous Q12H Paudel, Keshab, MD   Stopped at 08/23/24 0228   Chlorhexidine  Gluconate Cloth 2 % PADS 6 each  6 each Topical Daily Trudy Anthony HERO, MD   6 each at 08/23/24 1100   melatonin tablet 2.5 mg  2.5 mg Oral QHS Paudel, Keshab, MD   2.5 mg at 08/22/24 2224   morphine  (PF) 2 MG/ML injection 2 mg  2 mg Intravenous Q4H PRN Paudel, Keshab, MD       ondansetron  (ZOFRAN ) tablet 4 mg  4 mg Oral Q6H PRN Roann Gouty, MD        Or   ondansetron  (ZOFRAN ) injection 4 mg  4 mg Intravenous Q6H PRN Roann Gouty, MD       oxyCODONE  (Oxy IR/ROXICODONE ) immediate release tablet 5 mg  5 mg Oral Q4H PRN Paudel, Keshab, MD   5 mg at 08/22/24 1941   polyethylene glycol (MIRALAX  / GLYCOLAX ) packet 17 g  17 g Oral Daily PRN Roann Gouty, MD       Objective: Vital signs in last 24 hours: Temp:  [98.3 F (36.8 C)-99.8 F (37.7 C)] 98.3 F (36.8 C) (12/18 0914) Pulse Rate:  [86-109] 88 (12/18 0914) Resp:  [14-32] 16 (12/18 0914) BP: (108-153)/(50-67) 118/53 (12/18 0914) SpO2:  [95 %-100 %] 97 % (12/18 0914)  Intake/Output from previous day: 12/17 0701 - 12/18 0700 In: 1722 [I.V.:992; Blood:630; IV Piggyback:100] Out: 750 [Urine:750] Intake/Output this shift: Total I/O In: 200 [P.O.:200] Out: -   Physical Exam Vitals and nursing note reviewed.  Constitutional:      General: She is not in acute distress.    Appearance: She is not ill-appearing, toxic-appearing or diaphoretic.  HENT:     Head: Normocephalic and atraumatic.  Pulmonary:     Effort: Pulmonary effort is normal. No respiratory distress.  Skin:    General: Skin is warm and dry.  Neurological:     Mental Status: She is alert.  Psychiatric:        Mood and Affect: Mood normal.        Behavior: Behavior normal.    Lab Results:  Recent Labs    08/22/24 2322 08/23/24 0907  WBC 18.9* 17.6*  HGB 8.1* 8.4*  HCT 23.8* 25.0*  PLT 315 301   BMET Recent Labs    08/22/24 1158 08/23/24 0907  NA 135 137  K 3.9 3.7  CL 99 106  CO2 20* 23  GLUCOSE 131* 96  BUN 41* 21  CREATININE 1.02* 0.70  CALCIUM 9.2 7.9*   PT/INR Recent Labs    08/23/24 0907  LABPROT 15.5*  INR 1.2   Studies/Results: CT HEMATURIA WORKUP Result Date: 08/22/2024 EXAM: CT UROGRAM 08/22/2024 04:28:14 PM TECHNIQUE: CT of the abdomen and pelvis was performed before and after the administration of intravenous contrast as per CT urogram protocol. Multiplanar reformatted  images as well as MIP urogram images are provided for review. Automated exposure control, iterative reconstruction, and/or weight based adjustment of the mA/kV was utilized to reduce the radiation dose to as low as reasonably achievable. CONTRAST: 100 cc omnipaque  300 COMPARISON: None available. CLINICAL HISTORY: Hematuria. FINDINGS: LOWER CHEST: 4 mm calcified granuloma in the left lower lobe on image 10 series 3. Several other small nodules in the 2 to 3 mm range in the lung bases are observed and there is a 5 x 4 mm right middle lobe nodule on image 14 series 4. According to the Fleischner Society pulmonary nodule recommendations, the finding is consistent with multiple solid nodules, with the largest nodule measuring 5 x 4 mm, and the recommendation is: for low-risk patients, no routine follow-up is required; for high-risk patients, optional CT at 12 months. Low density blood pool compatible with anemia. Mild mitral valve calcification. Descending thoracic aortic atherosclerotic vascular calcification. Small posterior eventration of the right hemidiaphragm contains adipose tissue. LIVER: Relatively atrophic left hepatic lobe. GALLBLADDER AND BILE DUCTS: Gallbladder is unremarkable. No biliary ductal dilatation. SPLEEN: No acute abnormality. PANCREAS: No acute abnormality. ADRENAL GLANDS: No acute abnormality. KIDNEYS, URETERS AND BLADDER: Roush catheter is present in the urinary bladder with urinary bladder wall thickening, cystitis not excluded. Small amount of gas and fluid in the urinary bladder. No significant abnormal filling defect identified in the renal collecting systems or ureters. I don't see an obvious mass in the urinary bladder although portions of the urinary bladder are thicker than others (for example on the right on image 67 series 12). Reportedly head catheter placement there was feculent urine return concerning for colovesical fistula; the urinary bladder is in close proximity to the cecum  and loops of small bowel but a definite fistula is not identified. There is an anastomotic staple line in the rectum but the uterus is still present providing some separation of the urinary bladder from the postoperative portion of the rectum. Cystogram or limited barium enema could be utilized to further assess for fistula if clinically indicated. No stones in the kidneys or ureters. No hydronephrosis. No perinephric or periureteral stranding. GI AND BOWEL: Stomach demonstrates no acute abnormality. There is no bowel obstruction. Retrocecal soft tissue density is likely chronic and therapy related. There is an anastomotic staple line in the rectum but the uterus is still present providing some separation of the urinary bladder from the postoperative portion of the rectum. PERITONEUM AND RETROPERITONEUM: No  ascites. No free air. VASCULATURE: Systemic atherosclerosis is present, including the aorta and iliac arteries. Atheromatous plaque dorsally at the origins of the celiac trunk and superior mesenteric artery. LYMPH NODES: No lymphadenopathy. REPRODUCTIVE ORGANS: The uterus is still present providing some separation of the urinary bladder from the postoperative portion of the rectum. BONES AND SOFT TISSUES: Sclerosis along the sacral ala adjacent to the sacroiliac joint, possibly degenerative or due to remote sacral insufficiency fracture. Grade 1 degenerative anterolisthesis at L3-L4 and L4-L5. No focal soft tissue abnormality. IMPRESSION: 1. 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 Further evaluation with cystogram or limited barium enema could be considered if clinically indicated. 2. Sclerosis along the sacral ala adjacent to the sacroiliac joint and grade 1  degenerative anterolisthesis at L3-4 and L4-5. 3. Additional chronic and incidental findings include low-density blood pool compatible with anemia, systemic atherosclerosis, mild mitral valve calcification, small posterior right hemidiaphragm eventration, relative left hepatic lobe atrophy, chronic-appearing retrocecal soft tissue density, and small pulmonary nodules for which a follow-up CT at 12 months is optional for high-risk patients but not indicated for low-risk patients. Electronically signed by: Ryan Salvage MD 08/22/2024 05:14 PM EST RP Workstation: [id number]   Bladder Irrigation  Due to feculent urine patient is present today for a bladder irrigation. Patient was cleaned and prepped in a sterile fashion. 250 mL of sterile water was instilled into the bladder with a 70mL [place] syringe through the catheter in place.  of urine return was cleared from the bladder with evacuation of <3ccs of clot and feculent material. Efflux cleared from pink to clear with the procedure and the catheter irrigated easily. Upon completion, the catheter was draining well and was reattached to the night bag for drainage. Patient tolerated well.   Performed by: Lucie Hones, PA-C and Clotilda Cornwall, PA-C  Assessment & Plan: 83 y.o. 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  We specifically discussed that this could suggest recurrence of her colon cancer or a new bladder malignancy.  I irrigated her Foley catheter at the bedside today.  Small-volume feculent and clot material evacuated from the bladder.  I recommended continuing Foley catheter with plans for outpatient cystoscopy/voiding trial with Dr. Francisca.  She is adamantly opposed to going home with a catheter and states  she would rather go home to be with Jesus.  Recommendations: - Continue Foley catheter.  I will revisit her tomorrow morning to irrigate her catheter 1 more time.  Catheter may be removed prior to discharge. - Outpatient cystoscopy with Dr. Francisca in 2 weeks - Recommend discharge on 2 weeks of p.o. antibiotics, narrow per culture results  Lucie Hones, PA-C 08/23/2024

## 2024-08-24 DIAGNOSIS — Z87440 Personal history of urinary (tract) infections: Secondary | ICD-10-CM | POA: Diagnosis not present

## 2024-08-24 DIAGNOSIS — A419 Sepsis, unspecified organism: Secondary | ICD-10-CM | POA: Diagnosis not present

## 2024-08-24 DIAGNOSIS — R82998 Other abnormal findings in urine: Secondary | ICD-10-CM | POA: Diagnosis not present

## 2024-08-24 DIAGNOSIS — N390 Urinary tract infection, site not specified: Secondary | ICD-10-CM | POA: Diagnosis not present

## 2024-08-24 DIAGNOSIS — D649 Anemia, unspecified: Secondary | ICD-10-CM | POA: Diagnosis not present

## 2024-08-24 LAB — URINE CULTURE: Culture: >=100000 — AB

## 2024-08-24 MED ORDER — SODIUM CHLORIDE 0.9 % IV SOLN
1.0000 g | Freq: Four times a day (QID) | INTRAVENOUS | Status: DC
  Administered 2024-08-24 – 2024-08-25 (×5): 1 g via INTRAVENOUS
  Filled 2024-08-24 (×7): qty 1000

## 2024-08-24 MED ORDER — AMOXICILLIN 500 MG PO CAPS
500.0000 mg | ORAL_CAPSULE | Freq: Three times a day (TID) | ORAL | Status: DC
  Administered 2024-08-24: 500 mg via ORAL
  Filled 2024-08-24 (×2): qty 1

## 2024-08-24 MED ORDER — MELATONIN 5 MG PO TABS: 5.0000 mg | ORAL_TABLET | Freq: Every day | ORAL | Status: DC

## 2024-08-24 NOTE — Progress Notes (Signed)
 Urology Inpatient Progress Note  Subjective: No acute events overnight. She was febrile overnight, Tmax 38.2 C.  BP stable. No a.m. labs available for review.  Urine culture growing pansensitive E faecalis, antibiotics adjusted as below.  Blood cultures pending with no growth at 2 days. Foley catheter in place draining pink-tinged urine.  There is scant feculent debris in the proximal aspects of the tubing. She reports no acute concerns today.  She again states she would like to have her catheter removed before she goes home.  She also expresses questions about what scope of treatment she would want moving forward in light of her faith and oncologic history.  Anti-infectives: Anti-infectives (From admission, onward)    Start     Dose/Rate Route Frequency Ordered Stop   08/24/24 0945  amoxicillin (AMOXIL) capsule 500 mg        500 mg Oral Every 8 hours 08/24/24 0845 09/07/24 0559   08/23/24 0200  ceFEPIme  (MAXIPIME ) 2 g in sodium chloride  0.9 % 100 mL IVPB  Status:  Discontinued        2 g 200 mL/hr over 30 Minutes Intravenous Every 12 hours 08/22/24 1610 08/24/24 0845   08/22/24 1330  ceFEPIme  (MAXIPIME ) 2 g in sodium chloride  0.9 % 100 mL IVPB        2 g 200 mL/hr over 30 Minutes Intravenous  Once 08/22/24 1323 08/22/24 1521       Current Facility-Administered Medications  Medication Dose Route Frequency Provider Last Rate Last Admin   acetaminophen  (TYLENOL ) tablet 650 mg  650 mg Oral Q6H PRN Paudel, Keshab, MD   650 mg at 08/22/24 2224   Or   acetaminophen  (TYLENOL ) suppository 650 mg  650 mg Rectal Q6H PRN Paudel, Keshab, MD       amoxicillin (AMOXIL) capsule 500 mg  500 mg Oral Q8H Trudy Anthony HERO, MD   500 mg at 08/24/24 0940   Chlorhexidine  Gluconate Cloth 2 % PADS 6 each  6 each Topical Daily Trudy Anthony HERO, MD   6 each at 08/24/24 0935   melatonin tablet 2.5 mg  2.5 mg Oral QHS Paudel, Nena, MD   2.5 mg at 08/23/24 2128   morphine  (PF) 2 MG/ML injection 2 mg  2 mg  Intravenous Q4H PRN Paudel, Keshab, MD       ondansetron  (ZOFRAN ) tablet 4 mg  4 mg Oral Q6H PRN Paudel, Keshab, MD       Or   ondansetron  (ZOFRAN ) injection 4 mg  4 mg Intravenous Q6H PRN Roann Nena, MD       oxyCODONE  (Oxy IR/ROXICODONE ) immediate release tablet 5 mg  5 mg Oral Q4H PRN Paudel, Keshab, MD   5 mg at 08/22/24 1941   polyethylene glycol (MIRALAX  / GLYCOLAX ) packet 17 g  17 g Oral Daily PRN Roann Nena, MD         Objective: Vital signs in last 24 hours: Temp:  [97.3 F (36.3 C)-100.8 F (38.2 C)] 97.3 F (36.3 C) (12/19 0914) Pulse Rate:  [86-106] 103 (12/19 0914) Resp:  [16-18] 16 (12/19 0914) BP: (123-129)/(57-68) 123/57 (12/19 0914) SpO2:  [92 %-96 %] 96 % (12/19 0914)  Intake/Output from previous day: 12/18 0701 - 12/19 0700 In: 600 [P.O.:600] Out: 1600 [Urine:1600] Intake/Output this shift: Total I/O In: -  Out: 725 [Urine:725]  Physical Exam Vitals and nursing note reviewed.  Constitutional:      General: She is not in acute distress.    Appearance: She is not ill-appearing, toxic-appearing  or diaphoretic.  HENT:     Head: Normocephalic and atraumatic.  Pulmonary:     Effort: Pulmonary effort is normal. No respiratory distress.  Skin:    General: Skin is warm and dry.  Neurological:     Mental Status: She is alert and oriented to person, place, and time.  Psychiatric:        Mood and Affect: Mood normal.        Behavior: Behavior normal.     Lab Results:  Recent Labs    08/23/24 0907 08/23/24 1638  WBC 17.6* 20.4*  HGB 8.4* 9.3*  HCT 25.0* 27.0*  PLT 301 310   BMET Recent Labs    08/22/24 1158 08/23/24 0907  NA 135 137  K 3.9 3.7  CL 99 106  CO2 20* 23  GLUCOSE 131* 96  BUN 41* 21  CREATININE 1.02* 0.70  CALCIUM 9.2 7.9*   PT/INR Recent Labs    08/23/24 0907  LABPROT 15.5*  INR 1.2   Studies/Results: CT HEMATURIA WORKUP Result Date: 08/22/2024 EXAM: CT UROGRAM 08/22/2024 04:28:14 PM TECHNIQUE: CT of the  abdomen and pelvis was performed before and after the administration of intravenous contrast as per CT urogram protocol. Multiplanar reformatted images as well as MIP urogram images are provided for review. Automated exposure control, iterative reconstruction, and/or weight based adjustment of the mA/kV was utilized to reduce the radiation dose to as low as reasonably achievable. CONTRAST: 100 cc omnipaque  300 COMPARISON: None available. CLINICAL HISTORY: Hematuria. FINDINGS: LOWER CHEST: 4 mm calcified granuloma in the left lower lobe on image 10 series 3. Several other small nodules in the 2 to 3 mm range in the lung bases are observed and there is a 5 x 4 mm right middle lobe nodule on image 14 series 4. According to the Fleischner Society pulmonary nodule recommendations, the finding is consistent with multiple solid nodules, with the largest nodule measuring 5 x 4 mm, and the recommendation is: for low-risk patients, no routine follow-up is required; for high-risk patients, optional CT at 12 months. Low density blood pool compatible with anemia. Mild mitral valve calcification. Descending thoracic aortic atherosclerotic vascular calcification. Small posterior eventration of the right hemidiaphragm contains adipose tissue. LIVER: Relatively atrophic left hepatic lobe. GALLBLADDER AND BILE DUCTS: Gallbladder is unremarkable. No biliary ductal dilatation. SPLEEN: No acute abnormality. PANCREAS: No acute abnormality. ADRENAL GLANDS: No acute abnormality. KIDNEYS, URETERS AND BLADDER: Roush catheter is present in the urinary bladder with urinary bladder wall thickening, cystitis not excluded. Small amount of gas and fluid in the urinary bladder. No significant abnormal filling defect identified in the renal collecting systems or ureters. I don't see an obvious mass in the urinary bladder although portions of the urinary bladder are thicker than others (for example on the right on image 67 series 12). Reportedly head  catheter placement there was feculent urine return concerning for colovesical fistula; the urinary bladder is in close proximity to the cecum and loops of small bowel but a definite fistula is not identified. There is an anastomotic staple line in the rectum but the uterus is still present providing some separation of the urinary bladder from the postoperative portion of the rectum. Cystogram or limited barium enema could be utilized to further assess for fistula if clinically indicated. No stones in the kidneys or ureters. No hydronephrosis. No perinephric or periureteral stranding. GI AND BOWEL: Stomach demonstrates no acute abnormality. There is no bowel obstruction. Retrocecal soft tissue density is likely chronic and  therapy related. There is an anastomotic staple line in the rectum but the uterus is still present providing some separation of the urinary bladder from the postoperative portion of the rectum. PERITONEUM AND RETROPERITONEUM: No ascites. No free air. VASCULATURE: Systemic atherosclerosis is present, including the aorta and iliac arteries. Atheromatous plaque dorsally at the origins of the celiac trunk and superior mesenteric artery. LYMPH NODES: No lymphadenopathy. REPRODUCTIVE ORGANS: The uterus is still present providing some separation of the urinary bladder from the postoperative portion of the rectum. BONES AND SOFT TISSUES: Sclerosis along the sacral ala adjacent to the sacroiliac joint, possibly degenerative or due to remote sacral insufficiency fracture. Grade 1 degenerative anterolisthesis at L3-L4 and L4-L5. No focal soft tissue abnormality. IMPRESSION: 1. 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 Further evaluation with cystogram or  limited barium enema could be considered if clinically indicated. 2. Sclerosis along the sacral ala adjacent to the sacroiliac joint and grade 1 degenerative anterolisthesis at L3-4 and L4-5. 3. Additional chronic and incidental findings include low-density blood pool compatible with anemia, systemic atherosclerosis, mild mitral valve calcification, small posterior right hemidiaphragm eventration, relative left hepatic lobe atrophy, chronic-appearing retrocecal soft tissue density, and small pulmonary nodules for which a follow-up CT at 12 months is optional for high-risk patients but not indicated for low-risk patients. 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 Patient was cleaned and prepped in a sterile fashion. 60 mL of sterile water was instilled into the bladder with a 70mL Toomey syringe through the catheter in place.  60mL of urine return was cleared from the bladder with evacuation of 0ccs of clot or feculent material. Efflux cleared from pink-tinged to clear with the procedure and the catheter irrigated easily. Upon completion, the catheter was draining well and was reattached to the night bag for drainage. Patient tolerated well.   Assessment & Plan: 83 y.o. 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  I again shared our recommendation to keep her Foley catheter in place until we see her for outpatient cystoscopy.  We specifically discussed the risks of removal before discharge including recurrent versus persistent infection, pain, and urinary obstruction.  She states she understands these risks but would still like her  catheter removed.  She is hesitant to pursue recommended cystoscopy and states that she wants to go home and see how I do.  She states that we are not sure that she has a problem.  I reminded her that the fecal material in her bladder is an indicator that there is a problem.  What we do not know is the cause for her presumed fistula.  We had a lengthy and very honest conversation and we discussed that outpatient cystoscopy will help us  to determine the underlying etiology of her fistula, and that the differential for this includes but is not limited to colon or bladder malignancy.  She states that she is not sure if she would want to treat another  cancer.  I explained that merely diagnosing a cancer does not obligate her to any particular scope of treatment.  I encouraged her to keep plans for outpatient cystoscopy, or at least to keep that appointment to have a further conversation with Dr. Francisca at that time.  I think she would benefit from palliative care involvement based on cystoscopy outcome to better define her desired scope of care, though I think that is premature at this stage.  She ultimately agreed and expressed gratitude for our conversation.  Recommendations: - Okay to remove Foley catheter prior to discharge - Outpatient cystoscopy with Dr. Francisca on 09/26/2024 (scheduled--I offered to move this appointment up sooner, but she prefers to keep it as-is) - Recommend discharge on at least 2 weeks of p.o. antibiotics, narrow per culture results  Lucie Hones, PA-C 08/24/2024

## 2024-08-24 NOTE — Progress Notes (Addendum)
 " PROGRESS NOTE    Cindy Finley  FMW:[id number] DOB: Jul 05, 1941 DOA: 08/22/2024 PCP: [name], MD   Assessment & Plan:   Principal Problem:   Acute hemorrhagic cystitis Active Problems:   Gross hematuria   Hyperlipidemia   History of colon cancer  Assessment and Plan: Sepsis: met criteria w/ leukocytosis, tachycardia, lactic acidosis and UTI. Abxs changed to IV ampicillin . Still spiking fevers    Complicated UTI: w/ hematuria. Continue w/ foley and will d/c prior to discharge tomorrow as pt refuses to go home with foley. Urine cx grew enterococcus faecalis. Abxs changed to IV ampicillin . Possible colovesical fistula. Per urology there was a fecal material coming out. CT hematuria shows colovesical fistula was not directly demonstrated on today's examination, consider cystogram or limited barium enema. Urology following and recs apprec. Hx of colon cancer in 2021 but has since been in remission as per pt    Acute blood loss anemia: possibly secondary to hematuria. S/p 2 units of pRBCs transfused so far. H&H are trending up today    HLD: holding home dose of statin        DVT prophylaxis: SCDs Code Status: DNR Family Communication:  Disposition Plan: likely d/c back home  Level of care: Telemetry  Status is: Observation The patient remains OBS appropriate and will d/c before 2 midnights.    Consultants:    Procedures:   Antimicrobials: ampicillin     Subjective: Pt still c/o discomfort from the foley.   Objective: Vitals:   08/23/24 2011 08/23/24 2211 08/24/24 0214 08/24/24 0914  BP: 125/68 (!) 129/57 (!) 128/59 (!) 123/57  Pulse: (!) 106 96 86 (!) 103  Resp: 18 18 18 16   Temp: (!) 100.8 F (38.2 C) 99.2 F (37.3 C) 99.9 F (37.7 C) (!) 97.3 F (36.3 C)  TempSrc:  Oral    SpO2: 93% 93% 92% 96%  Weight:      Height:        Intake/Output Summary (Last 24 hours) at 08/24/2024 0950 Last data filed at 08/24/2024 9081 Gross per 24 hour  Intake 400  ml  Output 2325 ml  Net -1925 ml   Filed Weights   08/22/24 1156  Weight: 55.3 kg    Examination:  General exam: Appears comfortable  Respiratory system: clear breath sounds b/l  Cardiovascular system: S1 & S2+. No rubs or clicks   Gastrointestinal system: Abd is soft, NT, ND & hypoactive bowel sounds  Central nervous system: alert & awake. Moves all extremities  Psychiatry: Judgement and insight appears at baseline. Appropriate mood and affect     Data Reviewed: I have personally reviewed following labs and imaging studies  CBC: Recent Labs  Lab 08/22/24 1158 08/22/24 2322 08/23/24 0907 08/23/24 1638  WBC 20.1* 18.9* 17.6* 20.4*  NEUTROABS 17.0*  --   --   --   HGB 5.5* 8.1* 8.4* 9.3*  HCT 17.2* 23.8* 25.0* 27.0*  MCV 95.6 86.9 88.0 85.4  PLT 469* 315 301 310   Basic Metabolic Panel: Recent Labs  Lab 08/22/24 1158 08/23/24 0907  NA 135 137  K 3.9 3.7  CL 99 106  CO2 20* 23  GLUCOSE 131* 96  BUN 41* 21  CREATININE 1.02* 0.70  CALCIUM 9.2 7.9*   GFR: Estimated Creatinine Clearance: 41.4 mL/min (by C-G formula based on SCr of 0.7 mg/dL). Liver Function Tests: Recent Labs  Lab 08/22/24 1158 08/23/24 0907  AST 49* 71*  ALT 24 29  ALKPHOS 65 58  BILITOT 0.4  1.0  PROT 6.6 5.4*  ALBUMIN 3.9 3.0*   No results for input(s): LIPASE, AMYLASE in the last 168 hours. No results for input(s): AMMONIA in the last 168 hours. Coagulation Profile: Recent Labs  Lab 08/23/24 0907  INR 1.2   Cardiac Enzymes: No results for input(s): CKTOTAL, CKMB, CKMBINDEX, TROPONINI in the last 168 hours. BNP (last 3 results) No results for input(s): PROBNP in the last 8760 hours. HbA1C: No results for input(s): HGBA1C in the last 72 hours. CBG: No results for input(s): GLUCAP in the last 168 hours. Lipid Profile: No results for input(s): CHOL, HDL, LDLCALC, TRIG, CHOLHDL, LDLDIRECT in the last 72 hours. Thyroid Function Tests: No results  for input(s): TSH, T4TOTAL, FREET4, T3FREE, THYROIDAB in the last 72 hours. Anemia Panel: No results for input(s): VITAMINB12, FOLATE, FERRITIN, TIBC, IRON, RETICCTPCT in the last 72 hours. Sepsis Labs: Recent Labs  Lab 08/22/24 1158 08/22/24 1358 08/22/24 2322 08/23/24 0221  LATICACIDVEN 3.2* 3.5* 0.9 0.7    Recent Results (from the past 240 hours)  Microscopic Examination     Status: Abnormal   Collection Time: 08/22/24 11:03 AM   Urine  Result Value Ref Range Status   WBC, UA >30 (A) 0 - 5 /hpf Final   RBC, Urine >30 (A) 0 - 2 /hpf Final   Epithelial Cells (non renal) 0-10 0 - 10 /hpf Final   Crystals Present (A) N/A Final   Crystal Type Amorphous Sediment N/A Final   Bacteria, UA Many (A) None seen/Few Final  Urine Culture     Status: Abnormal   Collection Time: 08/22/24 11:58 AM   Specimen: Urine, Random  Result Value Ref Range Status   Specimen Description   Final    URINE, RANDOM Performed at [facility], [address] [place], [place] [address]    Special Requests   Final    NONE Reflexed from [id number] Performed at [facility], [address] [place], [place] [address]    Culture >=100,000 COLONIES/mL ENTEROCOCCUS FAECALIS (A)  Final   Report Status 08/24/2024 FINAL  Final   Organism ID, Bacteria ENTEROCOCCUS FAECALIS (A)  Final      Susceptibility   Enterococcus faecalis - MIC*    AMPICILLIN  8 SENSITIVE Sensitive     NITROFURANTOIN <=16 SENSITIVE Sensitive     VANCOMYCIN 1 SENSITIVE Sensitive     * >=100,000 COLONIES/mL ENTEROCOCCUS FAECALIS  CULTURE, URINE COMPREHENSIVE     Status: None (Preliminary result)   Collection Time: 08/22/24 11:59 AM   Specimen: Urine   UR  Result Value Ref Range Status   Urine Culture, Comprehensive Preliminary report  Preliminary   Organism ID, Bacteria Comment  Preliminary    Comment: Microbiological testing to rule out the presence of possible pathogens is in  progress. 50,000-100,000 colony forming units per mL   Culture, blood (routine x 2)     Status: None (Preliminary result)   Collection Time: 08/22/24  2:46 PM   Specimen: BLOOD  Result Value Ref Range Status   Specimen Description BLOOD BLOOD LEFT FOREARM  Final   Special Requests   Final    BOTTLES DRAWN AEROBIC AND ANAEROBIC Blood Culture results may not be optimal due to an inadequate volume of blood received in culture bottles   Culture   Final    NO GROWTH 2 DAYS Performed at [facility], [address] [place], [place] [address]    Report Status PENDING  Incomplete  Culture, blood (routine x 2)  Status: None (Preliminary result)   Collection Time: 08/22/24  2:46 PM   Specimen: BLOOD  Result Value Ref Range Status   Specimen Description BLOOD RIGHT ANTECUBITAL  Final   Special Requests   Final    BOTTLES DRAWN AEROBIC AND ANAEROBIC Blood Culture results may not be optimal due to an inadequate volume of blood received in culture bottles   Culture   Final    NO GROWTH 2 DAYS Performed at [facility], [address], [place], [place] [address]    Report Status PENDING  Incomplete         Radiology Studies: CT HEMATURIA WORKUP Result Date: 08/22/2024 EXAM: CT UROGRAM 08/22/2024 04:28:14 PM TECHNIQUE: CT of the abdomen and pelvis was performed before and after the administration of intravenous contrast as per CT urogram protocol. Multiplanar reformatted images as well as MIP urogram images are provided for review. Automated exposure control, iterative reconstruction, and/or weight based adjustment of the mA/kV was utilized to reduce the radiation dose to as low as reasonably achievable. CONTRAST: 100 cc omnipaque  300 COMPARISON: None available. CLINICAL HISTORY: Hematuria. FINDINGS: LOWER CHEST: 4 mm calcified granuloma in the left lower lobe on image 10 series 3. Several other small nodules in the 2 to 3 mm range in the lung bases are observed and there  is a 5 x 4 mm right middle lobe nodule on image 14 series 4. According to the Fleischner Society pulmonary nodule recommendations, the finding is consistent with multiple solid nodules, with the largest nodule measuring 5 x 4 mm, and the recommendation is: for low-risk patients, no routine follow-up is required; for high-risk patients, optional CT at 12 months. Low density blood pool compatible with anemia. Mild mitral valve calcification. Descending thoracic aortic atherosclerotic vascular calcification. Small posterior eventration of the right hemidiaphragm contains adipose tissue. LIVER: Relatively atrophic left hepatic lobe. GALLBLADDER AND BILE DUCTS: Gallbladder is unremarkable. No biliary ductal dilatation. SPLEEN: No acute abnormality. PANCREAS: No acute abnormality. ADRENAL GLANDS: No acute abnormality. KIDNEYS, URETERS AND BLADDER: Roush catheter is present in the urinary bladder with urinary bladder wall thickening, cystitis not excluded. Small amount of gas and fluid in the urinary bladder. No significant abnormal filling defect identified in the renal collecting systems or ureters. I don't see an obvious mass in the urinary bladder although portions of the urinary bladder are thicker than others (for example on the right on image 67 series 12). Reportedly head catheter placement there was feculent urine return concerning for colovesical fistula; the urinary bladder is in close proximity to the cecum and loops of small bowel but a definite fistula is not identified. There is an anastomotic staple line in the rectum but the uterus is still present providing some separation of the urinary bladder from the postoperative portion of the rectum. Cystogram or limited barium enema could be utilized to further assess for fistula if clinically indicated. No stones in the kidneys or ureters. No hydronephrosis. No perinephric or periureteral stranding. GI AND BOWEL: Stomach demonstrates no acute abnormality. There  is no bowel obstruction. Retrocecal soft tissue density is likely chronic and therapy related. There is an anastomotic staple line in the rectum but the uterus is still present providing some separation of the urinary bladder from the postoperative portion of the rectum. PERITONEUM AND RETROPERITONEUM: No ascites. No free air. VASCULATURE: Systemic atherosclerosis is present, including the aorta and iliac arteries. Atheromatous plaque dorsally at the origins of the celiac trunk and superior mesenteric artery. LYMPH NODES: No  lymphadenopathy. REPRODUCTIVE ORGANS: The uterus is still present providing some separation of the urinary bladder from the postoperative portion of the rectum. BONES AND SOFT TISSUES: Sclerosis along the sacral ala adjacent to the sacroiliac joint, possibly degenerative or due to remote sacral insufficiency fracture. Grade 1 degenerative anterolisthesis at L3-L4 and L4-L5. No focal soft tissue abnormality. IMPRESSION: 1. 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 Further evaluation with cystogram or limited barium enema could be considered if clinically indicated. 2. Sclerosis along the sacral ala adjacent to the sacroiliac joint and grade 1 degenerative anterolisthesis at L3-4 and L4-5. 3. Additional chronic and incidental findings include low-density blood pool compatible with anemia, systemic atherosclerosis, mild mitral valve calcification, small posterior right hemidiaphragm eventration, relative left hepatic lobe atrophy, chronic-appearing retrocecal soft tissue density, and small pulmonary nodules for which a follow-up CT at 12 months is optional for high-risk patients but not indicated for low-risk patients. Electronically signed by:  Ryan Salvage MD 08/22/2024 05:14 PM EST RP Workstation: [id number]        Scheduled Meds:  amoxicillin   500 mg Oral Q8H   Chlorhexidine  Gluconate Cloth  6 each Topical Daily   melatonin  2.5 mg Oral QHS   Continuous Infusions:     LOS: 1 day       Anthony CHRISTELLA Pouch, MD [facility] Pager [phone]  If 7PM-7AM, please contact night-coverage [email or web address] 08/24/2024, 9:50 AM   "

## 2024-08-25 ENCOUNTER — Ambulatory Visit: Payer: Self-pay | Admitting: Urology

## 2024-08-25 ENCOUNTER — Other Ambulatory Visit: Payer: Self-pay

## 2024-08-25 DIAGNOSIS — N3001 Acute cystitis with hematuria: Secondary | ICD-10-CM | POA: Diagnosis not present

## 2024-08-25 LAB — CBC
HCT: 28.2 % — ABNORMAL LOW (ref 36.0–46.0)
Hemoglobin: 9.2 g/dL — ABNORMAL LOW (ref 12.0–15.0)
MCH: 29.1 pg (ref 26.0–34.0)
MCHC: 32.6 g/dL (ref 30.0–36.0)
MCV: 89.2 fL (ref 80.0–100.0)
Platelets: 324 K/uL (ref 150–400)
RBC: 3.16 MIL/uL — ABNORMAL LOW (ref 3.87–5.11)
RDW: 18 % — ABNORMAL HIGH (ref 11.5–15.5)
WBC: 10.7 K/uL — ABNORMAL HIGH (ref 4.0–10.5)
nRBC: 0.2 % (ref 0.0–0.2)

## 2024-08-25 LAB — CULTURE, URINE COMPREHENSIVE

## 2024-08-25 MED ORDER — AMOXICILLIN 875 MG PO TABS
875.0000 mg | ORAL_TABLET | Freq: Two times a day (BID) | ORAL | 0 refills | Status: AC
  Filled 2024-08-25: qty 26, 13d supply, fill #0

## 2024-08-25 NOTE — Discharge Summary (Signed)
 Physician Discharge Summary  Cindy Finley FMW:[id number] DOB: 10/02/1940 DOA: 08/22/2024  PCP: [name], MD  Admit date: 08/22/2024 Discharge date: 08/25/2024  Admitted From: home Disposition:  home  Recommendations for Outpatient Follow-up:  Follow up with PCP in 1-2 weeks F/u w/ uro, Dr. Francisca, for cystoscopy on 09/26/2024   Home Health:  Equipment/Devices:  Discharge Condition: stable  CODE STATUS: full  Diet recommendation: Regular  Brief/Interim Summary: HPI was taken from Dr. Roann: Cindy Finley is a pleasant 83 y.o. female with medical history significant for HLD, history of colon cancer s/p surgery and chemoradiation in 2001 who presented to ED from urology clinic today with 6 weeks of gross hematuria and reported recurrent UTIs.  Patient stated that she had first symptoms on November 1 and her primary care provider gave her Keflex for 5 days with improvement in her hematuria and dysuria.  After 3 weeks of completing the treatment she again had similar symptoms and called her primary care provider who advised to see urology as there is a recurrent urinary tract infection and there is something going on.  Urology appointment was today and urologist send her to emergency room for hematuria.  She denies any fever, chills, nausea, vomiting but she is feeling tired and not able to have energy to do things.   ED Course: Upon arrival to the ED, patient is found to be anemic with a hemoglobin of 5.5 and hematocrit of 17.2, leukocytosis to 20K with severe hematuria.  She was started on IV fluid, IV antibiotics and type and screen.  She will be transfused 2 units of PRBC.  Urology was consulted who advised to admit the patient under hospitalist service and hospital service was consulted for evaluation for admission.  Discharge Diagnoses:  Principal Problem:   Acute hemorrhagic cystitis Active Problems:   Gross hematuria   Hyperlipidemia   History of colon cancer  Sepsis:  met criteria w/ leukocytosis, tachycardia, lactic acidosis and UTI. Abxs changed to IV ampicillin  while inpatient and d/c home po amox. No fever today so far.   Complicated UTI: w/ hematuria. Continue w/ foley and will d/c prior to discharge tomorrow as pt refuses to go home with foley. Urine cx grew enterococcus faecalis. Abxs changed to IV ampicillin  inpatient and d/c home w/ po amox x 13 days. Possible colovesical fistula. Per urology there was a fecal material coming out. CT hematuria shows colovesical fistula was not directly demonstrated on today's examination, consider cystogram or limited barium enema. Urology following and recs apprec. Hx of colon cancer in 2021 but has since been in remission as per pt    Acute blood loss anemia: possibly secondary to hematuria. S/p 2 units of pRBCs transfused so far.  H&H are labile    HLD: restart home dose of statin    Discharge Instructions  Discharge Instructions     Diet general   Complete by: As directed    Discharge instructions   Complete by: As directed    F/u w/ urology, Dr. Francisca, for cystoscopy on 09/26/2024. F/u w/ PCP in 1-2 weeks   Increase activity slowly   Complete by: As directed       Allergies as of 08/25/2024   No Known Allergies      Medication List     PAUSE taking these medications    aspirin EC 81 MG tablet Wait to take this until your doctor or other care provider tells you to start again. Take by mouth.  STOP taking these medications    Cinnamon 500 MG capsule       TAKE these medications    amoxicillin  875 MG tablet Commonly known as: AMOXIL  Take 1 tablet (875 mg total) by mouth 2 (two) times daily for 13 days.   b complex vitamins capsule Take 1 capsule by mouth daily.   Calcium Carbonate-Vitamin D 600-400 MG-UNIT tablet Take by mouth.   Cholecalciferol 25 MCG (1000 UT) tablet Take by mouth.   diphenhydrAMINE 25 mg capsule Commonly known as: BENADRYL Take 25 mg by mouth  every 6 (six) hours as needed for sleep.   Fish Oil 1000 MG Caps Take by mouth.   melatonin 3 MG Tabs tablet Take 3 mg by mouth at bedtime.   Multi-Vitamin tablet Take 1 tablet by mouth daily.   simvastatin 10 MG tablet Commonly known as: ZOCOR   vitamin E 180 MG (400 UNITS) capsule Take by mouth.        Allergies[1]  Consultations: Urology    Procedures/Studies: CT HEMATURIA WORKUP Result Date: 08/22/2024 EXAM: CT UROGRAM 08/22/2024 04:28:14 PM TECHNIQUE: CT of the abdomen and pelvis was performed before and after the administration of intravenous contrast as per CT urogram protocol. Multiplanar reformatted images as well as MIP urogram images are provided for review. Automated exposure control, iterative reconstruction, and/or weight based adjustment of the mA/kV was utilized to reduce the radiation dose to as low as reasonably achievable. CONTRAST: 100 cc omnipaque  300 COMPARISON: None available. CLINICAL HISTORY: Hematuria. FINDINGS: LOWER CHEST: 4 mm calcified granuloma in the left lower lobe on image 10 series 3. Several other small nodules in the 2 to 3 mm range in the lung bases are observed and there is a 5 x 4 mm right middle lobe nodule on image 14 series 4. According to the Fleischner Society pulmonary nodule recommendations, the finding is consistent with multiple solid nodules, with the largest nodule measuring 5 x 4 mm, and the recommendation is: for low-risk patients, no routine follow-up is required; for high-risk patients, optional CT at 12 months. Low density blood pool compatible with anemia. Mild mitral valve calcification. Descending thoracic aortic atherosclerotic vascular calcification. Small posterior eventration of the right hemidiaphragm contains adipose tissue. LIVER: Relatively atrophic left hepatic lobe. GALLBLADDER AND BILE DUCTS: Gallbladder is unremarkable. No biliary ductal dilatation. SPLEEN: No acute abnormality. PANCREAS: No acute abnormality.  ADRENAL GLANDS: No acute abnormality. KIDNEYS, URETERS AND BLADDER: Roush catheter is present in the urinary bladder with urinary bladder wall thickening, cystitis not excluded. Small amount of gas and fluid in the urinary bladder. No significant abnormal filling defect identified in the renal collecting systems or ureters. I don't see an obvious mass in the urinary bladder although portions of the urinary bladder are thicker than others (for example on the right on image 67 series 12). Reportedly head catheter placement there was feculent urine return concerning for colovesical fistula; the urinary bladder is in close proximity to the cecum and loops of small bowel but a definite fistula is not identified. There is an anastomotic staple line in the rectum but the uterus is still present providing some separation of the urinary bladder from the postoperative portion of the rectum. Cystogram or limited barium enema could be utilized to further assess for fistula if clinically indicated. No stones in the kidneys or ureters. No hydronephrosis. No perinephric or periureteral stranding. GI AND BOWEL: Stomach demonstrates no acute abnormality. There is no bowel obstruction. Retrocecal soft tissue density is likely chronic and  therapy related. There is an anastomotic staple line in the rectum but the uterus is still present providing some separation of the urinary bladder from the postoperative portion of the rectum. PERITONEUM AND RETROPERITONEUM: No ascites. No free air. VASCULATURE: Systemic atherosclerosis is present, including the aorta and iliac arteries. Atheromatous plaque dorsally at the origins of the celiac trunk and superior mesenteric artery. LYMPH NODES: No lymphadenopathy. REPRODUCTIVE ORGANS: The uterus is still present providing some separation of the urinary bladder from the postoperative portion of the rectum. BONES AND SOFT TISSUES: Sclerosis along the sacral ala adjacent to the sacroiliac joint,  possibly degenerative or due to remote sacral insufficiency fracture. Grade 1 degenerative anterolisthesis at L3-L4 and L4-L5. No focal soft tissue abnormality. IMPRESSION: 1. 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 Further evaluation with cystogram or limited barium enema could be considered if clinically indicated. 2. Sclerosis along the sacral ala adjacent to the sacroiliac joint and grade 1 degenerative anterolisthesis at L3-4 and L4-5. 3. Additional chronic and incidental findings include low-density blood pool compatible with anemia, systemic atherosclerosis, mild mitral valve calcification, small posterior right hemidiaphragm eventration, relative left hepatic lobe atrophy, chronic-appearing retrocecal soft tissue density, and small pulmonary nodules for which a follow-up CT at 12 months is optional for high-risk patients but not indicated for low-risk patients. Electronically signed by: Ryan Salvage MD 08/22/2024 05:14 PM EST RP Workstation: [id number]   (Echo, Carotid, EGD, Colonoscopy, ERCP)    Subjective: Pt denies any abd pain or burning with urination    Discharge Exam: Vitals:   08/25/24 0339 08/25/24 0802  BP: 139/68 135/60  Pulse: 87 83  Resp: 16 16  Temp: 98.5 F (36.9 C) 98.8 F (37.1 C)  SpO2: 93% 96%   Vitals:   08/24/24 1451 08/24/24 2043 08/25/24 0339 08/25/24 0802  BP: (!) 126/56 (!) 152/60 139/68 135/60  Pulse: 93 93 87 83  Resp: 16 20 16 16   Temp: 98.1 F (36.7 C) 99.2 F (37.3 C) 98.5 F (36.9 C) 98.8 F (37.1 C)  TempSrc:      SpO2: 96% 93% 93% 96%  Weight:      Height:        General: Pt is alert, awake, not in acute distress Cardiovascular: S1/S2 +, no rubs, no gallops Respiratory: CTA  bilaterally, no wheezing, no rhonchi Abdominal: Soft, NT, ND, bowel sounds + Extremities: no edema, no cyanosis    The results of significant diagnostics from this hospitalization (including imaging, microbiology, ancillary and laboratory) are listed below for reference.     Microbiology: Recent Results (from the past 240 hours)  Microscopic Examination     Status: Abnormal   Collection Time: 08/22/24 11:03 AM   Urine  Result Value Ref Range Status   WBC, UA >30 (A) 0 - 5 /hpf Final   RBC, Urine >30 (A) 0 - 2 /hpf Final   Epithelial Cells (non renal) 0-10 0 - 10 /hpf Final   Crystals Present (A) N/A Final   Crystal Type Amorphous Sediment N/A Final   Bacteria, UA Many (A) None seen/Few Final  Urine Culture     Status: Abnormal   Collection Time: 08/22/24 11:58 AM   Specimen: Urine, Random  Result Value Ref Range Status   Specimen Description   Final  URINE, RANDOM Performed at [facility], [address] [place], [place] [address]    Special Requests   Final    NONE Reflexed from [id number] Performed at [facility], [address] [place], [place] [address]    Culture >=100,000 COLONIES/mL ENTEROCOCCUS FAECALIS (A)  Final   Report Status 08/24/2024 FINAL  Final   Organism ID, Bacteria ENTEROCOCCUS FAECALIS (A)  Final      Susceptibility   Enterococcus faecalis - MIC*    AMPICILLIN  8 SENSITIVE Sensitive     NITROFURANTOIN <=16 SENSITIVE Sensitive     VANCOMYCIN 1 SENSITIVE Sensitive     * >=100,000 COLONIES/mL ENTEROCOCCUS FAECALIS  CULTURE, URINE COMPREHENSIVE     Status: None (Preliminary result)   Collection Time: 08/22/24 11:59 AM   Specimen: Urine   UR  Result Value Ref Range Status   Urine Culture, Comprehensive Preliminary report  Preliminary   Organism ID, Bacteria Comment  Preliminary    Comment: Microbiological testing to rule out the presence of possible pathogens is in progress. 50,000-100,000 colony forming units per mL   Culture,  blood (routine x 2)     Status: None (Preliminary result)   Collection Time: 08/22/24  2:46 PM   Specimen: BLOOD  Result Value Ref Range Status   Specimen Description BLOOD BLOOD LEFT FOREARM  Final   Special Requests   Final    BOTTLES DRAWN AEROBIC AND ANAEROBIC Blood Culture results may not be optimal due to an inadequate volume of blood received in culture bottles   Culture   Final    NO GROWTH 3 DAYS Performed at [facility], [address], [place], [place] [address]    Report Status PENDING  Incomplete  Culture, blood (routine x 2)     Status: None (Preliminary result)   Collection Time: 08/22/24  2:46 PM   Specimen: BLOOD  Result Value Ref Range Status   Specimen Description BLOOD RIGHT ANTECUBITAL  Final   Special Requests   Final    BOTTLES DRAWN AEROBIC AND ANAEROBIC Blood Culture results may not be optimal due to an inadequate volume of blood received in culture bottles   Culture   Final    NO GROWTH 3 DAYS Performed at [facility], [address] [place], [place] [address]    Report Status PENDING  Incomplete     Labs: BNP (last 3 results) No results for input(s): BNP in the last 8760 hours. Basic Metabolic Panel: Recent Labs  Lab 08/22/24 1158 08/23/24 0907  NA 135 137  K 3.9 3.7  CL 99 106  CO2 20* 23  GLUCOSE 131* 96  BUN 41* 21  CREATININE 1.02* 0.70  CALCIUM 9.2 7.9*   Liver Function Tests: Recent Labs  Lab 08/22/24 1158 08/23/24 0907  AST 49* 71*  ALT 24 29  ALKPHOS 65 58  BILITOT 0.4 1.0  PROT 6.6 5.4*  ALBUMIN 3.9 3.0*   No results for input(s): LIPASE, AMYLASE in the last 168 hours. No results for input(s): AMMONIA in the last 168 hours. CBC: Recent Labs  Lab 08/22/24 1158 08/22/24 2322 08/23/24 0907 08/23/24 1638 08/25/24 1132  WBC 20.1* 18.9* 17.6* 20.4* 10.7*  NEUTROABS 17.0*  --   --   --   --   HGB 5.5* 8.1* 8.4* 9.3* 9.2*  HCT 17.2* 23.8* 25.0* 27.0* 28.2*  MCV 95.6 86.9 88.0 85.4 89.2   PLT 469* 315 301 310 324   Cardiac Enzymes: No results for input(s): CKTOTAL,  CKMB, CKMBINDEX, TROPONINI in the last 168 hours. BNP: Invalid input(s): POCBNP CBG: No results for input(s): GLUCAP in the last 168 hours. D-Dimer No results for input(s): DDIMER in the last 72 hours. Hgb A1c No results for input(s): HGBA1C in the last 72 hours. Lipid Profile No results for input(s): CHOL, HDL, LDLCALC, TRIG, CHOLHDL, LDLDIRECT in the last 72 hours. Thyroid function studies No results for input(s): TSH, T4TOTAL, T3FREE, THYROIDAB in the last 72 hours.  Invalid input(s): FREET3 Anemia work up No results for input(s): VITAMINB12, FOLATE, FERRITIN, TIBC, IRON, RETICCTPCT in the last 72 hours. Urinalysis    Component Value Date/Time   COLORURINE YELLOW (A) 08/22/2024 1158   APPEARANCEUR TURBID (A) 08/22/2024 1158   APPEARANCEUR CANCELED 08/22/2024 1103   LABSPEC 1.018 08/22/2024 1158   PHURINE 8.0 08/22/2024 1158   GLUCOSEU NEGATIVE 08/22/2024 1158   HGBUR MODERATE (A) 08/22/2024 1158   BILIRUBINUR NEGATIVE 08/22/2024 1158   KETONESUR 5 (A) 08/22/2024 1158   PROTEINUR 100 (A) 08/22/2024 1158   PROTEINUR CANCELED 08/22/2024 1103   NITRITE NEGATIVE 08/22/2024 1158   LEUKOCYTESUR NEGATIVE 08/22/2024 1158   Sepsis Labs Recent Labs  Lab 08/22/24 2322 08/23/24 0907 08/23/24 1638 08/25/24 1132  WBC 18.9* 17.6* 20.4* 10.7*   Microbiology Recent Results (from the past 240 hours)  Microscopic Examination     Status: Abnormal   Collection Time: 08/22/24 11:03 AM   Urine  Result Value Ref Range Status   WBC, UA >30 (A) 0 - 5 /hpf Final   RBC, Urine >30 (A) 0 - 2 /hpf Final   Epithelial Cells (non renal) 0-10 0 - 10 /hpf Final   Crystals Present (A) N/A Final   Crystal Type Amorphous Sediment N/A Final   Bacteria, UA Many (A) None seen/Few Final  Urine Culture     Status: Abnormal   Collection Time: 08/22/24 11:58 AM   Specimen:  Urine, Random  Result Value Ref Range Status   Specimen Description   Final    URINE, RANDOM Performed at [facility], [address] [place], [place] [address]    Special Requests   Final    NONE Reflexed from [id number] Performed at [facility], [address] [place], [place] [address]    Culture >=100,000 COLONIES/mL ENTEROCOCCUS FAECALIS (A)  Final   Report Status 08/24/2024 FINAL  Final   Organism ID, Bacteria ENTEROCOCCUS FAECALIS (A)  Final      Susceptibility   Enterococcus faecalis - MIC*    AMPICILLIN  8 SENSITIVE Sensitive     NITROFURANTOIN <=16 SENSITIVE Sensitive     VANCOMYCIN 1 SENSITIVE Sensitive     * >=100,000 COLONIES/mL ENTEROCOCCUS FAECALIS  CULTURE, URINE COMPREHENSIVE     Status: None (Preliminary result)   Collection Time: 08/22/24 11:59 AM   Specimen: Urine   UR  Result Value Ref Range Status   Urine Culture, Comprehensive Preliminary report  Preliminary   Organism ID, Bacteria Comment  Preliminary    Comment: Microbiological testing to rule out the presence of possible pathogens is in progress. 50,000-100,000 colony forming units per mL   Culture, blood (routine x 2)     Status: None (Preliminary result)   Collection Time: 08/22/24  2:46 PM   Specimen: BLOOD  Result Value Ref Range Status   Specimen Description BLOOD BLOOD LEFT FOREARM  Final   Special Requests   Final    BOTTLES DRAWN AEROBIC AND ANAEROBIC Blood Culture results may not be optimal due to an inadequate volume  of blood received in culture bottles   Culture   Final    NO GROWTH 3 DAYS Performed at [facility], [address] [place], [place] [address]    Report Status PENDING  Incomplete  Culture, blood (routine x 2)     Status: None (Preliminary result)   Collection Time: 08/22/24  2:46 PM   Specimen: BLOOD  Result Value Ref Range Status   Specimen Description BLOOD RIGHT ANTECUBITAL  Final   Special Requests   Final    BOTTLES DRAWN AEROBIC AND  ANAEROBIC Blood Culture results may not be optimal due to an inadequate volume of blood received in culture bottles   Culture   Final    NO GROWTH 3 DAYS Performed at [facility], [address] [place], [place] [address]    Report Status PENDING  Incomplete     Time coordinating discharge: 38 minutes  SIGNED:   Anthony CHRISTELLA Pouch, MD  [facility] 08/25/2024, 1:21 PM Pager   If 7PM-7AM, please contact night-coverage [email or web address]     [1] No Known Allergies

## 2024-08-25 NOTE — Plan of Care (Signed)
  Problem: Clinical Measurements: Goal: Ability to maintain clinical measurements within normal limits will improve Outcome: Progressing   Problem: Elimination: Goal: Will not experience complications related to bowel motility Outcome: Progressing   Problem: Safety: Goal: Ability to remain free from injury will improve Outcome: Progressing   

## 2024-08-27 LAB — CULTURE, BLOOD (ROUTINE X 2)
Culture: NO GROWTH
Culture: NO GROWTH

## 2024-09-26 ENCOUNTER — Ambulatory Visit (INDEPENDENT_AMBULATORY_CARE_PROVIDER_SITE_OTHER): Admitting: Urology

## 2024-09-26 VITALS — BP 166/74 | Ht 62.0 in | Wt 120.0 lb

## 2024-09-26 DIAGNOSIS — R310 Gross hematuria: Secondary | ICD-10-CM | POA: Diagnosis not present

## 2024-09-26 MED ORDER — NITROFURANTOIN MONOHYD MACRO 100 MG PO CAPS
100.0000 mg | ORAL_CAPSULE | Freq: Once | ORAL | Status: AC
  Administered 2024-09-26: 100 mg via ORAL

## 2024-09-26 MED ORDER — LIDOCAINE HCL URETHRAL/MUCOSAL 2 % EX GEL
1.0000 | Freq: Once | CUTANEOUS | Status: AC
  Administered 2024-09-26: 1 via URETHRAL

## 2024-09-26 MED ORDER — NITROFURANTOIN MONOHYD MACRO 100 MG PO CAPS: 100.0000 mg | ORAL_CAPSULE | Freq: Two times a day (BID) | ORAL | 0 refills | Status: DC

## 2024-09-26 NOTE — Progress Notes (Signed)
 Cystoscopy Procedure Note:  Indication: Gross hematuria  She presented 08/22/2024 with gross hematuria and brown-colored urine, catheter was placed and irrigated with return of feculent material but minimal clot burden.  Also with anemia that required blood transfusion.  CT with no definite evidence of malignancy but possible colovesical fistula.  History of surgery and radiation for colon cancer.  After informed consent and discussion of the procedure and its risks, Cindy Finley was positioned and prepped in the standard fashion. Cystoscopy was performed with a flexible cystoscope.  Vision was extremely limited secondary to gross hematuria and dark-colored urine.  Urine was aspirated but still remained poor vision.  Urine was sent for cytology and culture.  Findings: Vision extremely poor, likely blood clot within the bladder  ----------------------------------------------------  Assessment and Plan: 84 year old female with prior history of colon cancer treated with surgery and radiation with gross hematuria starting mid December 2025.  CT concerning for possible colovesical fistula but no definite evidence of malignancy.  Hematuria has resolved previously with antibiotics, but recurred over the last week.  Cystoscopy today very limited by gross hematuria.  I strongly recommended the OR for cystoscopy and bladder biopsy/fulguration.  We discussed possible etiologies including bladder cancer, radiation cystitis, colovesical fistula.  She remains fairly hesitant about considering invasive treatments, however high suspicion that she will represent to ER with anemia/gross hematuria/clot retention without further intervention.  We discussed that pending etiology of her gross hematuria treatment options can vary significantly and ultimately all treatment decisions remain completely up to her in terms of how aggressive she would like to be.  She is fairly adamantly opposed to having a catheter, and I had  a frank conversation with her that she may need to have a Foley for up to a week after surgery pending intraoperative findings.  She remains very concerned about this.  Risks of procedure discussed including bleeding, infection, bladder injury, need for temporary Foley, need for additional procedures pending pathologic findings.  Schedule cystoscopy, bladder biopsy and fulguration Urine sent for culture and cytology today Started on nitrofurantoin  100 mg twice daily x 10 weeks for concern for UTI  Redell Burnet, MD 09/26/2024

## 2024-09-26 NOTE — Patient Instructions (Signed)
Bladder Biopsy A bladder biopsy is a procedure to remove a small sample of tissue from the bladder. The tissue is checked under a microscope to diagnose or rule out bladder cancer. During a bladder biopsy, your health care provider may guide a long, thin instrument with a lighted camera (cystoscope) through your urethra and into your bladder. This lets your health care provider see the lining of your urethra and bladder and take a tissue sample. If your ureters need to be checked, a longer tube (ureteroscope) may be used instead. Tell your health care provider about: Any allergies you have. All medicines you are taking, including vitamins, herbs, eye drops, creams, and over-the-counter medicines. Any problems you or family members have had with anesthetic medicines. Any bleeding problems you have. Any surgeries you have had. Any medical conditions you have. Whether you are pregnant or may be pregnant. What are the risks? Generally, this is a safe procedure. However, problems may occur, including: Bleeding. Infection. Allergic reactions to medicines. Damage to nearby structures or organs. These may include the urethra, bladder, and ureters. Pain in the abdomen and pain when you urinate. Narrowing of the urethra from scar tissue. Trouble urinating from swelling. What happens before the procedure? Medicines Ask your health care provider about: Changing or stopping your regular medicines. This is especially important if you are taking diabetes medicines or blood thinners. Taking medicines such as aspirin and ibuprofen. These medicines can thin your blood. Do not take these medicines unless your health care provider tells you to take them. Taking over-the-counter medicines, vitamins, herbs, and supplements. Surgery safety Ask your health care provider: How your surgery site will be marked. What steps will be taken to help prevent infection. These steps may include: Removing hair at the  surgery site. Washing skin with a germ-killing soap. Receiving antibiotic medicine. General instructions Follow instructions from your health care provider about eating and drinking restrictions. You may be asked to drink plenty of fluids. You may be asked to urinate right before the procedure. Your urine may be tested for infection. If you will be going home right after the procedure, plan to have a responsible adult: Take you home from the hospital or clinic. You will not be allowed to drive. Care for you for the time you are told. What happens during the procedure?  An IV may be inserted into one of your veins. You may be given one or more of the following: A medicine to help you relax (sedative). A medicine to numb the opening of the urethra (local anesthetic). A medicine to make you fall asleep (general anesthetic). You will lie on your back with your knees bent and spread apart. The cystoscope or ureteroscope will be put into your urethra and guided into your bladder or ureters. Your bladder may be filled with germ-free (sterile) water. This will help your health care provider see the wall or lining of your bladder. Small instruments will be put through the scope to take a tissue sample to look at under a microscope. The procedure may vary among health care providers and hospitals. What happens after the procedure? Your blood pressure, heart rate, breathing rate, and blood oxygen level will be monitored until you leave the hospital or clinic. You may be asked to empty your bladder, or it may be emptied for you. If you were given a sedative during the procedure, it can affect you for several hours. Do not drive or operate machinery until your health care provider says that  it is safe. Summary A bladder biopsy is a procedure to remove a small tissue sample from the bladder. A bladder biopsy can be done to diagnose or rule out bladder cancer. Follow instructions from your health care  provider about what you can eat or drink and about any changes to your medicines. Plan to have a responsible adult take you home and care for you as told. This information is not intended to replace advice given to you by your health care provider. Make sure you discuss any questions you have with your health care provider. Document Revised: 08/02/2021 Document Reviewed: 08/02/2021 Elsevier Patient Education  2024 [employer].

## 2024-09-27 ENCOUNTER — Other Ambulatory Visit: Payer: Self-pay

## 2024-09-27 DIAGNOSIS — N329 Bladder disorder, unspecified: Secondary | ICD-10-CM

## 2024-09-27 NOTE — Progress Notes (Unsigned)
 Surgical Physician Order Form [facility]  Dr. Redell Burnet, MD  * Scheduling expectation : Next Available  *Length of Case: 1 hour  *Clearance needed: no  *Anticoagulation Instructions: Hold all anticoagulants  *Aspirin Instructions: Hold Aspirin  *Post-op visit Date/Instructions:  1-2 week with pathology review  *Diagnosis: Bladder Lesion  *Procedure: Cysto Bladder Biopsy (47795)   Additional orders: N/A  -Admit type: OUTpatient  -Anesthesia: General  -VTE Prophylaxis Standing Order SCD's       Other:   -Standing Lab Orders Per Anesthesia    Lab other: CBC/BMP, UA&Urine Culture   -Standing Test orders EKG/Chest x-ray per Anesthesia       Test other:   - Medications: Ampicillin  1 g IV  -Other orders:  N/A

## 2024-09-28 ENCOUNTER — Telehealth: Payer: Self-pay

## 2024-09-28 NOTE — Progress Notes (Signed)
" ° °  [facility] Surgical Posting Form  Surgery Date: Date: 10/08/2024  Surgeon: Dr. Redell Burnet, MD  Inpt ( No  )   Outpt (Yes)   Obs ( No  )   Diagnosis: N32.9 Bladder Lesion  -CPT: 52204  Surgery: Cystoscopy with Bladder Biopsy  Stop Anticoagulations: Yes and will also hold ASA  Cardiac/Medical/Pulmonary Clearance needed: no  *Orders entered into EPIC  Date: 09/28/24   *Case booked in [place]  Date: 09/28/24  *Notified pt of Surgery: Date: 09/28/24  PRE-OP UA & CX: yes, obtained in clinic on 09/26/2024 Will also have patient CBC/BMP  *Placed into Prior Authorization Work [place] Date: 09/28/24  Assistant/laser/rep:No                "

## 2024-09-28 NOTE — Telephone Encounter (Signed)
 Per Dr. Francisca, Patient is to be scheduled for Cystoscopy with Bladder Biopsy   Cindy Finley was contacted and possible surgical dates were discussed, Monday February 2nd, 2026 was agreed upon for surgery.   Patient was directed to call [phone] between 1-3pm the day before surgery to find out surgical arrival time.  Instructions were given not to eat or drink from midnight on the night before surgery and have a driver for the day of surgery. On the surgery day patient was instructed to enter through the Medical Mall entrance of [facility] report the Same Day Surgery desk.   Pre-Admit Testing will be in contact via phone to set up an interview with the anesthesia team to review your history and medications prior to surgery.   Reminder of this information was sent via Mail to the patient.

## 2024-09-30 LAB — CULTURE, URINE COMPREHENSIVE

## 2024-10-03 ENCOUNTER — Other Ambulatory Visit: Payer: Self-pay

## 2024-10-03 ENCOUNTER — Encounter
Admission: RE | Admit: 2024-10-03 | Discharge: 2024-10-03 | Disposition: A | Discharge status: Home / Self Care | Source: Ambulatory Visit | Attending: Urology | Admitting: Urology

## 2024-10-03 LAB — "CYTOLOGY PLUS MONITORING PROFILE: PAP & FEULGEN "

## 2024-10-03 NOTE — Patient Instructions (Signed)
 Your procedure is scheduled on: Monday 10/08/24 Report to the Registration Desk on the 1st floor of the Medical Mall. To find out your arrival time, please call [phone] between 1PM - 3PM on: Friday 10/05/24 If your arrival time is 6:00 am, do not arrive before that time as the Medical Mall entrance doors do not open until 6:00 am.  REMEMBER: Instructions that are not followed completely may result in serious medical risk, up to and including death; or upon the discretion of your surgeon and anesthesiologist your surgery may need to be rescheduled.  Do not eat food or drink fluids after midnight the night before surgery.  No gum chewing or hard candies.   One week prior to surgery: Stop Anti-inflammatories (NSAIDS) such as Advil, Aleve, Ibuprofen, Motrin, Naproxen, Naprosyn and Aspirin based products such as Excedrin, Goody's Powder, BC Powder. Stop ANY OVER THE COUNTER supplements until after surgery. b complex vitamins  Calcium Carbonate-Vitamin D 600-400 MG-  Cholecalciferol 25 MCG (1000 UT)  Melatonin 10 MG  Multiple Vitamin (MULTI-VITAMIN  Omega-3 Fatty Acids (FISH OIL) 1000 MG  vitamin E 180 MG   You may however, continue to take Tylenol  if needed for pain up until the day of surgery.  Stop aspirin 81 MG EC tablet as instructed by your doctor.   Continue taking all of your other prescription medications up until the day of surgery.  ON THE DAY OF SURGERY ONLY TAKE THESE MEDICATIONS WITH SIPS OF WATER:  None    No Alcohol for 24 hours before or after surgery.  No Smoking including e-cigarettes for 24 hours before surgery.  No chewable tobacco products for at least 6 hours before surgery.  No nicotine patches on the day of surgery.  Do not use any recreational drugs for at least a week (preferably 2 weeks) before your surgery.  Please be advised that the combination of cocaine and anesthesia may have negative outcomes, up to and including death. If you test positive  for cocaine, your surgery will be cancelled.  On the morning of surgery brush your teeth with toothpaste and water, you may rinse your mouth with mouthwash if you wish. Do not swallow any toothpaste or mouthwash.  Use CHG Soap or wipes as directed on instruction sheet.  Do not wear jewelry, make-up, hairpins, clips or nail polish.  For welded (permanent) jewelry: bracelets, anklets, waist bands, etc.  Please have this removed prior to surgery.  If it is not removed, there is a chance that hospital personnel will need to cut it off on the day of surgery.  Do not wear lotions, powders, or perfumes.   Do not shave body hair from the neck down 48 hours before surgery.  Contact lenses, hearing aids and dentures may not be worn into surgery.  Do not bring valuables to the hospital. [facility] is not responsible for any missing/lost belongings or valuables.   Notify your doctor if there is any change in your medical condition (cold, fever, infection).  Wear comfortable clothing (specific to your surgery type) to the hospital.  After surgery, you can help prevent lung complications by doing breathing exercises.  Take deep breaths and cough every 1-2 hours. Your doctor may order a device called an Incentive Spirometer to help you take deep breaths. When coughing or sneezing, hold a pillow firmly against your incision with both hands. This is called splinting. Doing this helps protect your incision. It also decreases belly discomfort.  If you are being admitted to  the hospital overnight, leave your suitcase in the car. After surgery it may be brought to your room.  In case of increased patient census, it may be necessary for you, the patient, to continue your postoperative care in the Same Day Surgery department.  If you are being discharged the day of surgery, you will not be allowed to drive home. You will need a responsible individual to drive you home and stay with you for 24 hours after  surgery.   If you are taking public transportation, you will need to have a responsible individual with you.  Please call the Pre-admissions Testing Dept. at [phone] if you have any questions about these instructions.  Surgery Visitation Policy:  Patients having surgery or a procedure may have two visitors.  Children under the age of 35 must have an adult with them who is not the patient.  Inpatient Visitation:    Visiting hours are 7 a.m. to 8 p.m. Up to four visitors are allowed at one time in a patient room. The visitors may rotate out with other people during the day.  One visitor age 54 or older may stay with the patient overnight and must be in the room by 8 p.m.   Merchandiser, Retail to address health-related social needs:  [email or web address]

## 2024-10-05 ENCOUNTER — Emergency Department

## 2024-10-05 ENCOUNTER — Ambulatory Visit: Payer: Self-pay | Admitting: Urology

## 2024-10-05 ENCOUNTER — Other Ambulatory Visit: Payer: Self-pay

## 2024-10-05 ENCOUNTER — Encounter
Admission: RE | Admit: 2024-10-05 | Discharge: 2024-10-05 | Disposition: A | Source: Ambulatory Visit | Attending: Urology

## 2024-10-05 ENCOUNTER — Observation Stay
Admission: EM | Admit: 2024-10-05 | Discharge: 2024-10-11 | Disposition: A | Source: Home / Self Care | Attending: Hospitalist | Admitting: Hospitalist

## 2024-10-05 DIAGNOSIS — R0602 Shortness of breath: Secondary | ICD-10-CM | POA: Diagnosis not present

## 2024-10-05 DIAGNOSIS — R42 Dizziness and giddiness: Secondary | ICD-10-CM

## 2024-10-05 DIAGNOSIS — N329 Bladder disorder, unspecified: Secondary | ICD-10-CM | POA: Insufficient documentation

## 2024-10-05 DIAGNOSIS — Z01812 Encounter for preprocedural laboratory examination: Secondary | ICD-10-CM | POA: Insufficient documentation

## 2024-10-05 DIAGNOSIS — A419 Sepsis, unspecified organism: Principal | ICD-10-CM

## 2024-10-05 DIAGNOSIS — D494 Neoplasm of unspecified behavior of bladder: Secondary | ICD-10-CM

## 2024-10-05 DIAGNOSIS — C679 Malignant neoplasm of bladder, unspecified: Secondary | ICD-10-CM

## 2024-10-05 DIAGNOSIS — R310 Gross hematuria: Secondary | ICD-10-CM | POA: Diagnosis present

## 2024-10-05 DIAGNOSIS — E785 Hyperlipidemia, unspecified: Secondary | ICD-10-CM | POA: Diagnosis present

## 2024-10-05 DIAGNOSIS — D649 Anemia, unspecified: Secondary | ICD-10-CM | POA: Diagnosis not present

## 2024-10-05 DIAGNOSIS — N3001 Acute cystitis with hematuria: Secondary | ICD-10-CM | POA: Diagnosis present

## 2024-10-05 DIAGNOSIS — Z85038 Personal history of other malignant neoplasm of large intestine: Secondary | ICD-10-CM | POA: Diagnosis present

## 2024-10-05 HISTORY — DX: Other nonspecific abnormal finding of lung field: R91.8

## 2024-10-05 HISTORY — DX: Atherosclerosis of aorta: I70.0

## 2024-10-05 HISTORY — DX: Anemia, unspecified: D64.9

## 2024-10-05 HISTORY — DX: Hyperlipidemia, unspecified: E78.5

## 2024-10-05 HISTORY — DX: Insomnia, unspecified: G47.00

## 2024-10-05 LAB — CBC
HCT: 12.4 % — CL (ref 36.0–46.0)
Hemoglobin: 3.6 g/dL — CL (ref 12.0–15.0)
MCH: 24.7 pg — ABNORMAL LOW (ref 26.0–34.0)
MCHC: 29 g/dL — ABNORMAL LOW (ref 30.0–36.0)
MCV: 84.9 fL (ref 80.0–100.0)
Platelets: 423 10*3/uL — ABNORMAL HIGH (ref 150–400)
RBC: 1.46 MIL/uL — ABNORMAL LOW (ref 3.87–5.11)
RDW: 17.8 % — ABNORMAL HIGH (ref 11.5–15.5)
WBC: 25.1 10*3/uL — ABNORMAL HIGH (ref 4.0–10.5)
nRBC: 4.7 % — ABNORMAL HIGH (ref 0.0–0.2)

## 2024-10-05 LAB — LACTIC ACID, PLASMA
Lactic Acid, Venous: >9 mmol/L (ref 0.5–1.9)
Lactic Acid, Venous: 8.1 mmol/L (ref 0.5–1.9)

## 2024-10-05 LAB — HEPATIC FUNCTION PANEL
ALT: 604 U/L — ABNORMAL HIGH (ref 0–44)
AST: 645 U/L — ABNORMAL HIGH (ref 15–41)
Albumin: 3.7 g/dL (ref 3.5–5.0)
Alkaline Phosphatase: 82 U/L (ref 38–126)
Bilirubin, Direct: 0.2 mg/dL (ref 0.0–0.2)
Indirect Bilirubin: 0.2 mg/dL — ABNORMAL LOW (ref 0.3–0.9)
Total Bilirubin: 0.4 mg/dL (ref 0.0–1.2)
Total Protein: 6.4 g/dL — ABNORMAL LOW (ref 6.5–8.1)

## 2024-10-05 LAB — URINALYSIS, W/ REFLEX TO CULTURE (INFECTION SUSPECTED)
Bilirubin Urine: NEGATIVE
Glucose, UA: 50 mg/dL — AB
Ketones, ur: 5 mg/dL — AB
Nitrite: NEGATIVE
Protein, ur: 100 mg/dL — AB
Specific Gravity, Urine: 1.016 (ref 1.005–1.030)
WBC, UA: >50 WBC/hpf (ref 0–5)
pH: 5 (ref 5.0–8.0)

## 2024-10-05 LAB — BASIC METABOLIC PANEL WITH GFR
Anion gap: 29 — ABNORMAL HIGH (ref 5–15)
BUN: 32 mg/dL — ABNORMAL HIGH (ref 8–23)
CO2: 10 mmol/L — ABNORMAL LOW (ref 22–32)
Calcium: 8.6 mg/dL — ABNORMAL LOW (ref 8.9–10.3)
Chloride: 100 mmol/L (ref 98–111)
Creatinine, Ser: 1.31 mg/dL — ABNORMAL HIGH (ref 0.44–1.00)
GFR, Estimated: 40 mL/min — ABNORMAL LOW
Glucose, Bld: 117 mg/dL — ABNORMAL HIGH (ref 70–99)
Potassium: 4.6 mmol/L (ref 3.5–5.1)
Sodium: 139 mmol/L (ref 135–145)

## 2024-10-05 LAB — PREPARE RBC (CROSSMATCH)

## 2024-10-05 LAB — PROTIME-INR
INR: 1.3 — ABNORMAL HIGH (ref 0.8–1.2)
Prothrombin Time: 17 s — ABNORMAL HIGH (ref 11.4–15.2)

## 2024-10-05 MED ORDER — MORPHINE SULFATE (PF) 2 MG/ML IV SOLN: 2.0000 mg | INTRAVENOUS | Status: AC | PRN

## 2024-10-05 MED ORDER — SODIUM CHLORIDE 0.9 % IV SOLN: Freq: Once | INTRAVENOUS | Status: DC

## 2024-10-05 MED ORDER — ACETAMINOPHEN 650 MG RE SUPP: 650.0000 mg | Freq: Four times a day (QID) | RECTAL | Status: DC | PRN

## 2024-10-05 MED ORDER — VANCOMYCIN HCL IN DEXTROSE 1-5 GM/200ML-% IV SOLN
1000.0000 mg | Freq: Once | INTRAVENOUS | Status: AC
  Administered 2024-10-05: 1000 mg via INTRAVENOUS
  Filled 2024-10-05: qty 200

## 2024-10-05 MED ORDER — PIPERACILLIN-TAZOBACTAM 3.375 G IVPB 30 MIN
3.3750 g | Freq: Four times a day (QID) | INTRAVENOUS | Status: DC
  Administered 2024-10-05 – 2024-10-06 (×3): 3.375 g via INTRAVENOUS
  Filled 2024-10-05 (×3): qty 50

## 2024-10-05 MED ORDER — PIPERACILLIN-TAZOBACTAM 3.375 G IVPB 30 MIN
3.3750 g | Freq: Once | INTRAVENOUS | Status: AC
  Administered 2024-10-05: 3.375 g via INTRAVENOUS
  Filled 2024-10-05: qty 50

## 2024-10-05 MED ORDER — CHLORHEXIDINE GLUCONATE CLOTH 2 % EX PADS
6.0000 | MEDICATED_PAD | Freq: Every day | CUTANEOUS | Status: DC
  Administered 2024-10-08 – 2024-10-11 (×4): 6 via TOPICAL
  Filled 2024-10-05 (×3): qty 6

## 2024-10-05 MED ORDER — IOHEXOL 300 MG/ML  SOLN
100.0000 mL | Freq: Once | INTRAMUSCULAR | Status: AC | PRN
  Administered 2024-10-05: 100 mL via INTRAVENOUS

## 2024-10-05 MED ORDER — SODIUM CHLORIDE 0.9% IV SOLUTION
Freq: Once | INTRAVENOUS | Status: AC
  Filled 2024-10-05: qty 250

## 2024-10-05 MED ORDER — ACETAMINOPHEN 325 MG PO TABS
650.0000 mg | ORAL_TABLET | Freq: Four times a day (QID) | ORAL | Status: DC | PRN
  Administered 2024-10-08 – 2024-10-09 (×2): 650 mg via ORAL
  Filled 2024-10-05 (×2): qty 2

## 2024-10-05 MED ORDER — VANCOMYCIN VARIABLE DOSE PER UNSTABLE RENAL FUNCTION (PHARMACIST DOSING): Status: DC

## 2024-10-05 MED ORDER — ONDANSETRON HCL 4 MG PO TABS: 4.0000 mg | ORAL_TABLET | Freq: Four times a day (QID) | ORAL | Status: DC | PRN

## 2024-10-05 MED ORDER — ONDANSETRON HCL 4 MG/2ML IJ SOLN
4.0000 mg | Freq: Four times a day (QID) | INTRAMUSCULAR | Status: DC | PRN
  Administered 2024-10-08: 4 mg via INTRAVENOUS

## 2024-10-05 MED ORDER — HYDROXYZINE HCL 10 MG PO TABS
10.0000 mg | ORAL_TABLET | Freq: Once | ORAL | Status: DC
  Filled 2024-10-05: qty 1

## 2024-10-05 MED ORDER — FUROSEMIDE 10 MG/ML IJ SOLN
20.0000 mg | Freq: Once | INTRAMUSCULAR | Status: AC
  Administered 2024-10-05: 20 mg via INTRAVENOUS
  Filled 2024-10-05: qty 4

## 2024-10-05 MED ORDER — OXYCODONE HCL 5 MG PO TABS: 5.0000 mg | ORAL_TABLET | ORAL | Status: DC | PRN

## 2024-10-05 MED ORDER — SODIUM CHLORIDE 0.9 % IV BOLUS
1000.0000 mL | Freq: Once | INTRAVENOUS | Status: AC
  Administered 2024-10-05: 1000 mL via INTRAVENOUS

## 2024-10-05 MED ORDER — FUROSEMIDE 10 MG/ML IJ SOLN
40.0000 mg | Freq: Once | INTRAMUSCULAR | Status: AC
  Administered 2024-10-05: 40 mg via INTRAVENOUS
  Filled 2024-10-05: qty 4

## 2024-10-05 NOTE — ED Notes (Signed)
Attempting second IV.

## 2024-10-05 NOTE — ED Notes (Signed)
 Unable to get lactic. Calling lab.

## 2024-10-05 NOTE — ED Notes (Signed)
 Informed MD Ernest of H&H

## 2024-10-05 NOTE — ED Notes (Signed)
 Called blood bank, blood not ready. Not ordered emergency blood.

## 2024-10-05 NOTE — ED Notes (Signed)
 Spoke with EDP Ernest. New order for chest xray.

## 2024-10-05 NOTE — ED Triage Notes (Signed)
 Pt arrives via POV with c/o weakness that's been going on for a while. Pt is supposed to have a procedure on Monday for a bladder biopsy, but husband states she's not going to make it that long because she can't do anything for herself. During triage received a call from lab and hgb is 3.6.  Pt is A&ox4 during triage.

## 2024-10-05 NOTE — Progress Notes (Addendum)
" °  [facility] Perioperative Services: Pre-Admission/Anesthesia Testing  Abnormal Lab Notification   Date: 10/05/24  Name: CORRA KAINE MRN:   [id number]  Re: Abnormal labs noted during PAT appointment   Notified:    Provider Name Provider Role Notification Mode  [name], MD Urology (Surgeon) Communicated with MD via [facility]   ABNORMAL LAB VALUE(S):   Lab Results  Component Value Date   WBC 25.1 (H) 10/05/2024   HGB 3.6 (LL) 10/05/2024   HCT 12.4 (LL) 10/05/2024   MCV 84.9 10/05/2024   PLT 423 (H) 10/05/2024    Clinical Information and Notes:  ANNESHA DELGRECO is scheduled for a CYSTOSCOPY, WITH BIOPSY on 10/08/2024.  Patient with known bladder tumor.  Patient presented to the PAT clinic this morning for preoperative lab work.  During her time in PAT, patient complained of shortness of breath and feeling anxious.  Patient requested to be checked out in the ED.  She was taken to the emergency department per her request for further evaluation.    While waiting in the ED triage area, I received a critical result call from the lab.  Patient with hemoglobin of 3.6 g/dL and hematocrit of 87.5 %.  Call placed to ED first nurse Margret, RN) to make her aware.  Primary attending surgeon was made aware via [facility].  No further needs from the PAT department identified at this time.  Dorise Pereyra, MSN, APRN, FNP-C, CEN [facility]  Perioperative Services Nurse Practitioner Phone: [phone] Fax: [phone] 10/05/24 12:21 PM "

## 2024-10-05 NOTE — ED Notes (Signed)
 Lab at bedside to draw lactic

## 2024-10-05 NOTE — Progress Notes (Incomplete)
"  ° °      Overnight   NAME: Cindy Finley MRN: [id number] DOB : December 11, 1940    Date of Service   10/05/2024   HPI/Events of Note   HPI:   84 y.o. 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  Patient was admitted in December 2025 for similar problems with severe anemia and UTI requiring blood transfusion.  Antibiotics were given at that time along with urology evaluation.  Patient stated that she has been having blood in the urine since she was discharged from the hospital.  She had urology follow-up last week and today she was at urology office for blood work.  Patient was found to have severely low hemoglobin and hematocrit 3.6/12.4, patient was transferred to ED for evaluation.  Patient is alert awake and oriented.  But she is not a good historian.  She said she is not able to remember everything but she is feeling weak and passing blood in the urine.  She denies any nausea vomiting diarrhea.  She denies any fever chills.  She denies any chest pain or palpitations.  She denies any headache.  But she complains of shortness of breath after started on blood transfusion.    Overnight:  notified by RN of SOB and rales post 3rd unit of blood. RN reports this happened earlier and she was given 40 mg of lasix  with some improvement in symptoms. On arrival to room patient sitting up right in bed, SOB with increased WOB. Pt endorses anxiety but denies pain.   Physical Exam HENT:     Mouth/Throat:     Mouth: Mucous membranes are dry.  Eyes:     Pupils: Pupils are equal, round, and reactive to light.  Cardiovascular:     Rate and Rhythm: Tachycardia present.     Pulses:          Radial pulses are 2+ on the right side and 2+ on the left side.     Heart sounds: S1 normal and S2 normal.  Pulmonary:     Effort: Tachypnea present.      Breath sounds: Examination of the right-upper field reveals rales. Examination of the left-upper field reveals rales. Rales present.  Abdominal:     General: Abdomen is flat.  Skin:    General: Skin is warm and dry.     Capillary Refill: Capillary refill takes 2 to 3 seconds.     Coloration: Skin is pale.  Neurological:     Mental Status: She is alert.     GCS: GCS eye subscore is 4. GCS verbal subscore is 5. GCS motor subscore is 6.  Psychiatric:        Mood and Affect: Mood is anxious.       Interventions/ Plan   Repeat H and H  Bipap  20 mg lasix   10 mg atarax  for anxiety   Updates     Laneta Almarie Peter BSN RN CCRN AGACNP-BC Acute Care Nurse Practitioner [facility]  "

## 2024-10-05 NOTE — Consult Note (Signed)
 Pharmacy Antibiotic Note  ASSESSMENT: 84 y.o. female with PMH including rUTI is presenting with severe anemia in setting of hematuria. Patient with history of Enterococcus faecalis in urine in Dec 2025. Pharmacy has been consulted to manage vancomycin  dosing. They are also receiving Zosyn . Patient has unstable renal function right now because they are in AKI.  PLAN: Due to patient being in AKI, cannot implement scheduled vancomycin  regimen at this time Administer vancomycin  1000mg  IV x 1 as a loading dose. Monitor renal function and vancomycin  levels as clinically indicated for further dosing Follow up culture results to assess for antibiotic optimization.  Patient measurements: Height: 5' 2 (157.5 cm) IBW/kg (Calculated) : 50.1  Vital signs: Temp: 97.7 F (36.5 C) (01/30 1548) Temp Source: Oral (01/30 1548) BP: 155/76 (01/30 1600) Pulse Rate: 114 (01/30 1600) Recent Labs  Lab 10/05/24 1105  WBC 25.1*  CREATININE 1.31*   Estimated Creatinine Clearance: 25.3 mL/min (A) (by C-G formula based on SCr of 1.31 mg/dL (H)).  Allergies: Allergies[1]  Antimicrobials this admission: 1/30 Zosyn  >> 1/30 Vancomycin  >>  Dose adjustments this admission: n/a  Microbiology results: 1/30 BCx: sent 1.30 UCx: sent  Thank you for allowing pharmacy to be a part of this patients care.  Will M. Lenon, PharmD, BCPS Clinical Pharmacist 10/05/2024 6:12 PM     [1] No Known Allergies

## 2024-10-05 NOTE — ED Notes (Signed)
 Pt had near syncopal episode from moving in bed to place on bedpan. EDP came to bedside. 1 unit emergency blood ordered. Paper blood consent was already put in medical records.

## 2024-10-05 NOTE — Telephone Encounter (Signed)
-----   Message from Redell Burnet, MD sent at 10/05/2024 12:08 PM EST ----- Blood counts are extremely low and she needs to present to the emergency room immediately for likely blood transfusion and further evaluation.  Redell Burnet, MD 10/05/2024

## 2024-10-05 NOTE — ED Notes (Signed)
EDP was at bedside again.

## 2024-10-05 NOTE — ED Notes (Signed)
 Blood bank called. 3 other units ready. Tried to call CT to bring pt to CT. No answer.

## 2024-10-05 NOTE — ED Notes (Signed)
 Emergency unit of blood was started by other RN but was not running. Put on pump, running now.

## 2024-10-05 NOTE — ED Notes (Signed)
Paper blood consent signed

## 2024-10-05 NOTE — H&P (Signed)
 "  History and Physical    Cindy Finley:[id number] DOB: 22-Nov-1940 DOA: 10/05/2024  DOS: the patient was seen and examined on 10/05/2024  PCP: Cindy Lame, MD   Patient coming from: Home  I have personally briefly reviewed patient's old medical records in [facility]  Chief Complaint: Weakness  HPI: Cindy Finley is a pleasant 84 y.o. 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  Patient was admitted in December 2025 for similar problems with severe anemia and UTI requiring blood transfusion.  Antibiotics were given at that time along with urology evaluation.  Patient stated that she has been having blood in the urine since she was discharged from the hospital.  She had urology follow-up last week and today she was at urology office for blood work.  Patient was found to have severely low hemoglobin and hematocrit 3.6/12.4, patient was transferred to ED for evaluation.  Patient is alert awake and oriented.  But she is not a good historian.  She said she is not able to remember everything but she is feeling weak and passing blood in the urine.  She denies any nausea vomiting diarrhea.  She denies any fever chills.  She denies any chest pain or palpitations.  She denies any headache.  But she complains of shortness of breath after started on blood transfusion.  ED Course: Upon arrival to the ED, patient is found to be severely anemic with a hemoglobin of 3.6 and hematocrit of 12.4.  Patient had AST of 645 and ALT of 604.  BUN of 32 and creatinine of 1.31.  Patient was prepared for blood transfusion.  Urology service was consulted who advised to admit the patient under hospitalist service.  Hospitalist service was consulted for evaluation for admission for management of severe anemia due to hematuria.  Review of  Systems:  ROS  All other systems negative except as noted in the HPI.  Past Medical History:  Diagnosis Date   Basal cell carcinoma 10/30/2020   R buccal cheek    Colon cancer (HCC) 2001   chemo and radiation   Personal history of chemotherapy    colon ca   Personal history of radiation therapy    colon ca    Past Surgical History:  Procedure Laterality Date   BREAST BIOPSY Right 02/14/2017   benign   COLON SURGERY       reports that she has quit smoking. She has never used smokeless tobacco. No history on file for alcohol use and drug use.  Allergies[1]  Family History  Problem Relation Age of Onset   Breast cancer Sister 47    Prior to Admission medications  Medication Sig Start Date End Date Taking? Authorizing Provider  b complex vitamins capsule Take 1 capsule by mouth daily.   Yes [provider]  Calcium  Carbonate-Vitamin D 600-400 MG-UNIT tablet Take 1 tablet by mouth daily.   Yes [provider]  Cholecalciferol 25 MCG (1000 UT) tablet Take 1,000 Units by mouth daily.   Yes [provider]  Melatonin 10 MG TABS Take 10 mg by mouth at bedtime.   Yes [provider]  Multiple Vitamin (MULTI-VITAMIN) tablet Take 1 tablet by mouth daily.   Yes [provider]  Omega-3 Fatty Acids (FISH OIL) 1000 MG CAPS Take 1,000 mg by mouth daily.  Yes [provider]  psyllium (HYDROCIL/METAMUCIL) 95 % PACK Take 1 packet by mouth daily.   Yes [provider]  simvastatin (ZOCOR) 10 MG tablet Take 10 mg by mouth daily. 07/29/20  Yes [provider]  vitamin E 180 MG (400 UNITS) capsule Take 400 Units by mouth daily.   Yes [provider]  [Paused] aspirin  81 MG EC tablet Take 81 mg by mouth daily. Wait to take this until your doctor or other care provider tells you to start again.    [provider]  nitrofurantoin , macrocrystal-monohydrate, (MACROBID ) 100 MG capsule Take 1 capsule (100 mg  total) by mouth every 12 (twelve) hours. Patient not taking: Reported on 10/05/2024 09/26/24   Cindy Redell BROCKS, MD    Physical Exam: Vitals:   10/05/24 1540 10/05/24 1548 10/05/24 1600 10/05/24 1620  BP:  (!) 146/75 (!) 155/76   Pulse: (!) 110 (!) 113 (!) 114   Resp: (!) 27  (!) 29   Temp:  97.7 F (36.5 C)    TempSrc:  Oral    SpO2: (!) 89% 91% 92% (!) 88%  Height:        Physical Exam   Constitutional: Alert, awake, in respiratory distress HEENT: Neck supple, on oxygen Respiratory: Bilateral decreased air entry at the bases, basal fine rales. Cardiovascular: Regular rate and rhythm, no murmurs / rubs / gallops.  2+ bilateral lower extremity pitting edema. 2+ pedal pulses. No carotid bruits.  Abdomen: Soft, no tenderness, Bowel sounds positive.  Musculoskeletal: no clubbing / cyanosis. Good ROM, no contractures. Normal muscle tone.  Skin: no rashes, lesions, ulcers. Neurologic: CN 2-12 grossly intact. Sensation intact, No focal deficit identified Psychiatric: Alert and oriented x 3. Normal mood.    Labs on Admission: I have personally reviewed following labs and imaging studies  CBC: Recent Labs  Lab 10/05/24 1105  WBC 25.1*  HGB 3.6*  HCT 12.4*  MCV 84.9  PLT 423*   Basic Metabolic Panel: Recent Labs  Lab 10/05/24 1105  NA 139  K 4.6  CL 100  CO2 10*  GLUCOSE 117*  BUN 32*  CREATININE 1.31*  CALCIUM  8.6*   GFR: Estimated Creatinine Clearance: 25.3 mL/min (A) (by C-G formula based on SCr of 1.31 mg/dL (H)). Liver Function Tests: Recent Labs  Lab 10/05/24 1235  AST 645*  ALT 604*  ALKPHOS 82  BILITOT 0.4  PROT 6.4*  ALBUMIN 3.7   No results for input(s): LIPASE, AMYLASE in the last 168 hours. No results for input(s): AMMONIA in the last 168 hours. Coagulation Profile: Recent Labs  Lab 10/05/24 1235  INR 1.3*   Cardiac Enzymes: No results for input(s): CKTOTAL, CKMB, CKMBINDEX, TROPONINI, TROPONINIHS in the last 168 hours. BNP  (last 3 results) No results for input(s): BNP in the last 8760 hours. HbA1C: No results for input(s): HGBA1C in the last 72 hours. CBG: No results for input(s): GLUCAP in the last 168 hours. Lipid Profile: No results for input(s): CHOL, HDL, LDLCALC, TRIG, CHOLHDL, LDLDIRECT in the last 72 hours. Thyroid Function Tests: No results for input(s): TSH, T4TOTAL, FREET4, T3FREE, THYROIDAB in the last 72 hours. Anemia Panel: No results for input(s): VITAMINB12, FOLATE, FERRITIN, TIBC, IRON, RETICCTPCT in the last 72 hours. Urine analysis:    Component Value Date/Time   COLORURINE AMBER (A) 10/05/2024 1315   APPEARANCEUR CLOUDY (A) 10/05/2024 1315   APPEARANCEUR CANCELED 08/22/2024 1103   LABSPEC 1.016 10/05/2024 1315   PHURINE 5.0 10/05/2024 1315   GLUCOSEU 50 (  A) 10/05/2024 1315   HGBUR MODERATE (A) 10/05/2024 1315   BILIRUBINUR NEGATIVE 10/05/2024 1315   KETONESUR 5 (A) 10/05/2024 1315   PROTEINUR 100 (A) 10/05/2024 1315   NITRITE NEGATIVE 10/05/2024 1315   LEUKOCYTESUR SMALL (A) 10/05/2024 1315    Radiological Exams on Admission: I have personally reviewed images DG Chest Portable 1 View Result Date: 10/05/2024 EXAM: 1 VIEW(S) XRAY OF THE CHEST 10/05/2024 03:51:00 PM COMPARISON: 10/05/2024 at 1:03 PM. CLINICAL HISTORY: Shortness of breath. FINDINGS: LUNGS AND PLEURA: New diffuse interstitial opacities likely representing edema. No pleural effusion. No pneumothorax. HEART AND MEDIASTINUM: No acute abnormality of the cardiac and mediastinal silhouettes. BONES AND SOFT TISSUES: No acute osseous abnormality. IMPRESSION: 1. New diffuse interstitial opacities, likely representing edema. Electronically signed by: Lonni Necessary MD 10/05/2024 04:34 PM EST RP Workstation: [id number]   CT ABDOMEN PELVIS W CONTRAST Result Date: 10/05/2024 CLINICAL DATA:  Weakness, anemia, prior abnormal CT EXAM: CT ABDOMEN AND PELVIS WITH CONTRAST TECHNIQUE:  Multidetector CT imaging of the abdomen and pelvis was performed using the standard protocol following bolus administration of intravenous contrast. RADIATION DOSE REDUCTION: This exam was performed according to the departmental dose-optimization program which includes automated exposure control, adjustment of the mA and/or kV according to patient size and/or use of iterative reconstruction technique. CONTRAST:  OMNIPAQUE  IOHEXOL  300 MG/ML  SOLN COMPARISON:  08/22/2024 FINDINGS: Lower chest: Faint ground-glass opacities are seen at the lung bases, which may reflect aspiration, infection, or edema. Trace bilateral pleural effusions, left greater than right. Hepatobiliary: No focal liver abnormality is seen. No gallstones, gallbladder wall thickening, or biliary dilatation. Pancreas: Unremarkable. No pancreatic ductal dilatation or surrounding inflammatory changes. Spleen: Normal in size without focal abnormality. Adrenals/Urinary Tract: The kidneys enhance normally and symmetrically. No evidence of urinary tract calculi or obstruction. The adrenals are unremarkable. The bladder is moderately distended. Heterogeneous areas of increased attenuation are seen throughout the bladder lumen, consistent with blood products. Irregular mass along the right lateral bladder wall measuring 5.5 x 1.5 by 5.2 cm. Cystoscopy is recommended for further evaluation. Stomach/Bowel: No bowel obstruction or ileus. Prior sigmoid colon resection and reanastomosis. No bowel wall thickening or inflammatory change. Vascular/Lymphatic: Scattered atherosclerosis of the aorta and its branches unchanged. No pathologic adenopathy. Reproductive: Uterus and bilateral adnexa are unremarkable. Other: No free fluid or free intraperitoneal gas. No abdominal wall hernia. Musculoskeletal: There are no acute or destructive bony abnormalities. Reconstructed images demonstrate no additional findings. IMPRESSION: 1. Large irregular mass along the right  lateral wall the bladder, highly concerning for malignancy. Cystoscopy is recommended for further evaluation. 2. High density material throughout the bladder lumen consistent with blood products, please correlate with urinalysis. 3. Scattered bibasilar ground-glass airspace disease, which may be related to edema, infection, or inflammation. 4. Trace bilateral pleural effusions, left greater than right. 5.  Aortic Atherosclerosis (ICD10-I70.0). Electronically Signed   By: Ozell Daring M.D.   On: 10/05/2024 16:03   DG Chest Portable 1 View Result Date: 10/05/2024 CLINICAL DATA:  Sepsis, weakness EXAM: PORTABLE CHEST 1 VIEW COMPARISON:  01/09/2010 FINDINGS: Single frontal view of the chest demonstrates an unremarkable cardiac silhouette. No acute airspace disease, effusion, or pneumothorax. Small nodules are seen at the right lung base, largest measuring 5 mm, corresponding to findings on recent abdominal CT. No acute bony abnormalities. IMPRESSION: 1. Subcentimeter pulmonary nodules at the right lung base, corresponding to prior CT findings. These are indeterminate and require close follow-up in light of bladder abnormalities observed on prior exam. 2.  No acute airspace disease. Electronically Signed   By: Ozell Daring M.D.   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  1.  Severe symptomatic anemia/hematuria - Patient is complaining of shortness of breath. -  Hemoglobin is 3.6. - Patient has been started on emergency transfusion in the emergency room.  Second unit is going on right now. - She will be transfused 3 to 4 units and will check hemoglobin and hematocrit. - Patient has some rales on her lungs and we will give her 40 mg of Lasix . - Continue oxygen to maintain saturation more than 90%. - Urology has been called and they will come and see the patient for hematuria.  2.  Sepsis likely due to UTI - Cultures have been sent - Patient will be continued on Zosyn  and vancomycin  which was started in the emergency room - Follow-up cultures  3.  Acute hypoxia due to severe anemia and fluid overload - Continue oxygen to maintain saturation more than 90% - 40 mg Lasix  as mentioned above. - Continue to monitor oxygen.  4.  History of colorectal cancer/concern for colovesical fistula/CT scan showing bladder mass. - Defer to urology  5.  HLD - She will be continued her home dose of statin  6.  Transaminitis - May be part of severe anemia - Will continue to monitor   DVT prophylaxis: SCDs Code Status: DNR/DNI(Do NOT Intubate) Family Communication: None  Disposition Plan: Home vs rehab  Consults called: Urology  Admission status: Observation, Step Down Unit   Nena Rebel, MD [facility] 10/05/2024, 4:43 PM        [1] No Known Allergies  "

## 2024-10-05 NOTE — ED Notes (Signed)
 Pt eating small amt of peaches.

## 2024-10-05 NOTE — Progress Notes (Signed)
 Patient came for labs and stated she was not feeling well. When asked how she felt she stated she was short of breath when speaking and she felt weak overall. I drew her labs and asked her brother-in-law that was accompanying her to take her to the Emergency Department. He agreed to do so. I alerted the Nurse Practitioner of Pre-Admit Testing that the patient was on their way to the ED and her labs were taken down immediately.

## 2024-10-05 NOTE — Consult Note (Signed)
 ED Pharmacy Antibiotic Sign Off An antibiotic consult was received from an ED provider for Vancomycin  per pharmacy dosing for sepsis. A chart review was completed to assess appropriateness.   The following one time order(s) were placed:  Vancomycin  1g IV.  Further antibiotic and/or antibiotic pharmacy consults should be ordered by the admitting provider if indicated.   Thank you for allowing pharmacy to be a part of this patient's care.   Lauramae Kneisley A Evanne Matsunaga, [facility]  Clinical Pharmacist 10/05/24 1:17 PM

## 2024-10-05 NOTE — Telephone Encounter (Signed)
 Called pt to inform her of the need to present to ED. No answer. Per pt's chart she is currently in the [facility] waiting area for triage as of 12:16pm with an arrival time to the ED of 11:42am.

## 2024-10-05 NOTE — ED Notes (Signed)
In CT with pt.

## 2024-10-05 NOTE — ED Notes (Signed)
 IV's not giving blood, attempting to get second lactic.

## 2024-10-05 NOTE — ED Notes (Signed)
 Float RN getting emergency blood from blood bank.  Lab called, lactic >9, EDP informed.

## 2024-10-05 NOTE — ED Notes (Signed)
 Per verbal order stopped bolus. Coarse crackles were noted to R lung.

## 2024-10-05 NOTE — ED Notes (Signed)
 Pt feeling more SOB again. Notified EDP to come re-eval.

## 2024-10-05 NOTE — Consult Note (Signed)
 "    Urology Consult   I have been asked to see the patient by Dr. Ernest, for evaluation and management of gross hematuria, abnormal CT of bladder.  Chief Complaint: Weakness/dizziness  HPI:  Cindy Finley is a 84 y.o.  female known to urology group with history of colon cancer treated with surgery and chemoradiation in 2001 who initially presented to ER 08/22/2024 with 6 weeks of gross hematuria and reported recurrent UTIs.  Catheter placement at that time notable for frankly feculent urine, but no significant bleeding or blood clots.  Urine culture grew Enterococcus and she was treated with culture appropriate antibiotics, and transfuse for anemia with hematocrit of 17.  A CT urogram at that time showed some bladder wall thickening but no definite tumors, and possible colovesical fistula, but no evidence of any metastatic disease.  Close urology follow-up for cystoscopy was recommended at that time and she ultimately did not follow-up until 09/26/2024.  She underwent cystoscopy in clinic on 09/26/2024 which was extremely limited secondary to brown-colored urine.  Urine cytology was sent at that time and was negative for malignancy.  She presents today after being seen at preop with shortness of breath and having hematocrit level of 13.  She reports she continues to have some intermittent brown and red-colored urine.  She denies any significant abdominal pain or flank pain  A 14 French two-way Foley was placed prior to my arrival, and currently has clear yellow urine.  Lab work notable for lactate greater than 9, creatinine 1.3 with eGFR 48 from baseline normal, WBC 25, hematocrit 12.4  PMH: Past Medical History:  Diagnosis Date   Basal cell carcinoma 10/30/2020   R buccal cheek    Colon cancer (HCC) 2001   chemo and radiation   Personal history of chemotherapy    colon ca   Personal history of radiation therapy    colon ca    Surgical History: Past Surgical History:  Procedure  Laterality Date   BREAST BIOPSY Right 02/14/2017   benign   COLON SURGERY        Allergies: Allergies[1]  Family History: Family History  Problem Relation Age of Onset   Breast cancer Sister 4    Social History:  reports that she has quit smoking. She has never used smokeless tobacco. No history on file for alcohol use and drug use.  ROS: Negative aside from those stated in the HPI.  Physical Exam: BP (!) 155/76   Pulse (!) 114   Temp 97.7 F (36.5 C) (Oral)   Resp (!) 29   Ht 5' 2 (1.575 m)   SpO2 (!) 88%   BMI 22.31 kg/m    Constitutional: Pale, ill-appearing Cardiovascular: Tachycardic, regular rhythm Respiratory: Clear to auscultation bilaterally GI: Abdomen is soft, nontender, nondistended, no abdominal masses Foley with clear yellow urine  Laboratory Data: Reviewed in epic, see HPI  Pertinent Imaging: I have personally reviewed the most recent CT today suggesting a large irregular mass along the right lateral wall of the bladder, no hydronephrosis, no evidence of metastatic disease  Assessment & Plan:   Frail 84 year old female with history of colon cancer treated with surgery and chemoradiation in 2001 with 2 months of intermittent brown-colored feculent urine and gross hematuria.  Clinic cystoscopy was limited secondary to dark-colored urine but urine cytology was negative.  She is actually already on the OR schedule for cystoscopy, possible bladder biopsy/TURBT/fulguration/possible cystogram for Monday, 10/08/2024 with me.  Currently admitted with anemia, lightheadedness, shortness  of breath.  Suspect anemia related to gross hematuria as well as potential recurrent infections, as urine this afternoon is clear yellow.  High suspicion for either malignancy, radiation cystitis, colovesical fistula as primary etiology of her presentation.  She remains strongly averse to invasive procedures or aggressive surgery, after long conversation she remains interested in  proceeding to the OR for cystoscopy and possible bladder biopsy/TURBT/cystogram for better information about etiology of her symptoms, but she may prefer more of a palliative approach moving forward pending those findings.  She also is unwilling to be discharged home with a Foley catheter in place.   Recommendations:  - Agree with broad-spectrum antibiotics and follow-up urine culture - Agree with blood transfusion and aggressive resuscitation - Okay to upsize Foley if problems with draining, would recommend a 24 French Roush hematuria catheter.  Would not recommend CBI with high suspicion for colovesical fistula - Plan for OR Monday 10/08/24 for cystoscopy, transurethral resection of bladder tumor, cystogram  [name], MD  Total time spent on the floor was 80 minutes, with greater than 50% spent in counseling and coordination of care with the patient regarding hematuria, feculent urine, concern for malignancy/colovesical fistula/radiation cystitis, need for blood transfusion/resuscitation, antibiotics, and OR Monday for cystoscopy, transurethral resection of bladder tumor, cystogram.  [facility] [address] [place], [place] [address] [phone]      [1] No Known Allergies  "

## 2024-10-05 NOTE — ED Provider Notes (Addendum)
"  [facility] Provider Note    Event Date/Time   First MD Initiated Contact with Patient 10/05/24 1221     (approximate)   History   Weakness   HPI  Cindy Finley is a 84 y.o. femaleHLD, history of colon cancer s/p surgery and chemoradiation in 2001, hematuria with recurrent UTIs who comes in with concerns for weakness.  I reviewed the discharge summary from 08/25/2024 where patient was admitted with concerns for recurrent UTIs and hematuria.  Patient did have bladder irrigation and given 2 units of PRBC she was sent home without a Foley in place.  Patient reports that she has just got off antibiotics a few days ago..  She is reports some pain with urination still and feeling like she still has a UTI.  She reports having increasing weakness however over the past 1 week.  She is to continues to have blood in her urine but she denies any feeling of obstruction and still is getting her urine out.  She denies any rectal bleeding, vaginal bleeding or any other concerns.  She reports just feeling overall weak secondary to symptoms.  Physical Exam   Triage Vital Signs: ED Triage Vitals  Encounter Vitals Group     BP 10/05/24 1211 130/83     Girls Systolic BP Percentile --      Girls Diastolic BP Percentile --      Boys Systolic BP Percentile --      Boys Diastolic BP Percentile --      Pulse Rate 10/05/24 1211 (!) 114     Resp 10/05/24 1211 18     Temp 10/05/24 1211 (!) 97.5 F (36.4 C)     Temp Source 10/05/24 1211 Oral     SpO2 10/05/24 1211 100 %     Weight --      Height 10/05/24 1214 5' 2 (1.575 m)     Head Circumference --      Peak Flow --      Pain Score 10/05/24 1212 5     Pain Loc --      Pain Education --      Exclude from Growth Chart --     Most recent vital signs: Vitals:   10/05/24 1211  BP: 130/83  Pulse: (!) 114  Resp: 18  Temp: (!) 97.5 F (36.4 C)  SpO2: 100%     General: Awake, no distress.  CV:  Good peripheral perfusion.   Tachycardic Resp:  Normal effort.  Abd:  No distention.  Soft nontender Other:  Patient appeals pale but moving all extremities speaking in full sentences   ED Results / Procedures / Treatments   Labs (all labs ordered are listed, but only abnormal results are displayed) Labs Reviewed  CULTURE, BLOOD (ROUTINE X 2)  CULTURE, BLOOD (ROUTINE X 2)  PROTIME-INR  HEPATIC FUNCTION PANEL  LACTIC ACID, PLASMA  PREPARE RBC (CROSSMATCH)  TYPE AND SCREEN     EKG  My interpretation of EKG:  Sinus tachycardia rate 112 without any ST elevation or T wave versions, normal intervals  RADIOLOGY I have reviewed the xray personally and interpreted no evidence of any pneumonia   PROCEDURES:  Critical Care performed: Yes, see critical care procedure note(s)  .1-3 Lead EKG Interpretation  Performed by: Ernest Ronal BRAVO, MD Authorized by: Ernest Ronal BRAVO, MD     Interpretation: abnormal     ECG rate:  110   ECG rate assessment: tachycardic  Rhythm: sinus tachycardia     Ectopy: none     Conduction: normal   .Critical Care  Performed by: Ernest Ronal BRAVO, MD Authorized by: Ernest Ronal BRAVO, MD   Critical care provider statement:    Critical care time (minutes):  30   Critical care was necessary to treat or prevent imminent or life-threatening deterioration of the following conditions: symptomatic anemia.   Critical care was time spent personally by me on the following activities:  Development of treatment plan with patient or surrogate, discussions with consultants, evaluation of patient's response to treatment, examination of patient, ordering and review of laboratory studies, ordering and review of radiographic studies, ordering and performing treatments and interventions, pulse oximetry, re-evaluation of patient's condition and review of old charts    MEDICATIONS ORDERED IN ED: Medications  0.9 %  sodium chloride  infusion (has no administration in time range)  piperacillin -tazobactam  (ZOSYN ) IVPB 3.375 g (3.375 g Intravenous New Bag/Given 10/05/24 1324)  vancomycin  (VANCOCIN ) IVPB 1000 mg/200 mL premix (has no administration in time range)     IMPRESSION / MDM / ASSESSMENT AND PLAN / ED COURSE  I reviewed the triage vital signs and the nursing notes.   Patient's presentation is most consistent with acute presentation with potential threat to life or bodily function.   Patient comes in with concerns for low blood levels with prior history of this in the setting of recurrent hemorrhagic cystitis.  Patient's white count is elevated again.  Her abdomen is soft and nontender last and they did the CT scan there was nothing else that was surgical on examination I do not feel a repeat CT is indicated today given she is nontender she only reports pain with urination so I do suspect patient probably has recurrent UTI given she does r meet sepsis criteria with elevated white count elevated heart rate I will start off on broad-spectrum antibiotics with bank, Zosyn  given prior urine culture was sensitive to vancomycin .  Patient's hemoglobin however is notably low at 3.6 I do want a hold off on significant fluid resuscitation as I want to be able to give patient blood.  I ordered her for 3 units of blood.  I did a bedside ultrasound I do not see any signs of significant retention and she reports really not wanting to have a Foley and reports having good urination without feeling like retention like she did last time.  I will discuss the case with urology and admit patient to the hospital team.    Discussed with urology who will plan to see patient but agree with holding off on any bladder irrigation at this time and agree with admission to the hospital.  2:05 PM patient had syncopal episode with trying to lift her bottom up suspect related to hypotension and anemia therefore we will give a unit of emergency release blood.   The patient is on the cardiac monitor to evaluate for evidence of  arrhythmia and/or significant heart rate changes.      FINAL CLINICAL IMPRESSION(S) / ED DIAGNOSES   Final diagnoses:  Sepsis, due to unspecified organism, unspecified whether acute organ dysfunction present (HCC)  Symptomatic anemia     Rx / DC Orders   ED Discharge Orders     None        Note:  This document was prepared using Dragon voice recognition software and may include unintentional dictation errors.   Ernest Ronal BRAVO, MD 10/05/24 1342    Ernest Ronal BRAVO, MD 10/05/24  1406 ° °"

## 2024-10-05 NOTE — H&P (View-Only) (Signed)
 "    Urology Consult   I have been asked to see the patient by Dr. Ernest, for evaluation and management of gross hematuria, abnormal CT of bladder.  Chief Complaint: Weakness/dizziness  HPI:  Cindy Finley is a 84 y.o.  female known to urology group with history of colon cancer treated with surgery and chemoradiation in 2001 who initially presented to ER 08/22/2024 with 6 weeks of gross hematuria and reported recurrent UTIs.  Catheter placement at that time notable for frankly feculent urine, but no significant bleeding or blood clots.  Urine culture grew Enterococcus and she was treated with culture appropriate antibiotics, and transfuse for anemia with hematocrit of 17.  A CT urogram at that time showed some bladder wall thickening but no definite tumors, and possible colovesical fistula, but no evidence of any metastatic disease.  Close urology follow-up for cystoscopy was recommended at that time and she ultimately did not follow-up until 09/26/2024.  She underwent cystoscopy in clinic on 09/26/2024 which was extremely limited secondary to brown-colored urine.  Urine cytology was sent at that time and was negative for malignancy.  She presents today after being seen at preop with shortness of breath and having hematocrit level of 13.  She reports she continues to have some intermittent brown and red-colored urine.  She denies any significant abdominal pain or flank pain  A 14 French two-way Foley was placed prior to my arrival, and currently has clear yellow urine.  Lab work notable for lactate greater than 9, creatinine 1.3 with eGFR 48 from baseline normal, WBC 25, hematocrit 12.4  PMH: Past Medical History:  Diagnosis Date   Basal cell carcinoma 10/30/2020   R buccal cheek    Colon cancer (HCC) 2001   chemo and radiation   Personal history of chemotherapy    colon ca   Personal history of radiation therapy    colon ca    Surgical History: Past Surgical History:  Procedure  Laterality Date   BREAST BIOPSY Right 02/14/2017   benign   COLON SURGERY        Allergies: Allergies[1]  Family History: Family History  Problem Relation Age of Onset   Breast cancer Sister 4    Social History:  reports that she has quit smoking. She has never used smokeless tobacco. No history on file for alcohol use and drug use.  ROS: Negative aside from those stated in the HPI.  Physical Exam: BP (!) 155/76   Pulse (!) 114   Temp 97.7 F (36.5 C) (Oral)   Resp (!) 29   Ht 5' 2 (1.575 m)   SpO2 (!) 88%   BMI 22.31 kg/m    Constitutional: Pale, ill-appearing Cardiovascular: Tachycardic, regular rhythm Respiratory: Clear to auscultation bilaterally GI: Abdomen is soft, nontender, nondistended, no abdominal masses Foley with clear yellow urine  Laboratory Data: Reviewed in epic, see HPI  Pertinent Imaging: I have personally reviewed the most recent CT today suggesting a large irregular mass along the right lateral wall of the bladder, no hydronephrosis, no evidence of metastatic disease  Assessment & Plan:   Frail 84 year old female with history of colon cancer treated with surgery and chemoradiation in 2001 with 2 months of intermittent brown-colored feculent urine and gross hematuria.  Clinic cystoscopy was limited secondary to dark-colored urine but urine cytology was negative.  She is actually already on the OR schedule for cystoscopy, possible bladder biopsy/TURBT/fulguration/possible cystogram for Monday, 10/08/2024 with me.  Currently admitted with anemia, lightheadedness, shortness  of breath.  Suspect anemia related to gross hematuria as well as potential recurrent infections, as urine this afternoon is clear yellow.  High suspicion for either malignancy, radiation cystitis, colovesical fistula as primary etiology of her presentation.  She remains strongly averse to invasive procedures or aggressive surgery, after long conversation she remains interested in  proceeding to the OR for cystoscopy and possible bladder biopsy/TURBT/cystogram for better information about etiology of her symptoms, but she may prefer more of a palliative approach moving forward pending those findings.  She also is unwilling to be discharged home with a Foley catheter in place.   Recommendations:  - Agree with broad-spectrum antibiotics and follow-up urine culture - Agree with blood transfusion and aggressive resuscitation - Okay to upsize Foley if problems with draining, would recommend a 24 French Roush hematuria catheter.  Would not recommend CBI with high suspicion for colovesical fistula - Plan for OR Monday 10/08/24 for cystoscopy, transurethral resection of bladder tumor, cystogram  [name], MD  Total time spent on the floor was 80 minutes, with greater than 50% spent in counseling and coordination of care with the patient regarding hematuria, feculent urine, concern for malignancy/colovesical fistula/radiation cystitis, need for blood transfusion/resuscitation, antibiotics, and OR Monday for cystoscopy, transurethral resection of bladder tumor, cystogram.  [facility] [address] [place], [place] [address] [phone]      [1] No Known Allergies  "

## 2024-10-05 NOTE — Consult Note (Signed)
 CODE SEPSIS - PHARMACY COMMUNICATION  **Broad Spectrum Antibiotics should be administered within 1 hour of Sepsis diagnosis**  Time Code Sepsis Called/Page Received: 1247  Antibiotics Ordered: vancomycin , zosyn   Time of 1st antibiotic administration: 1324  Additional action taken by pharmacy: n/a  If necessary, Name of Provider/Nurse Contacted: n/a    Cindy Finley ,PharmD Clinical Pharmacist  10/05/2024  1:43 PM

## 2024-10-05 NOTE — ED Notes (Signed)
 This RN bringing pt to CT.

## 2024-10-06 ENCOUNTER — Other Ambulatory Visit: Payer: Self-pay

## 2024-10-06 LAB — CBC
HCT: 29.5 % — ABNORMAL LOW (ref 36.0–46.0)
HCT: 28.3 % — ABNORMAL LOW (ref 36.0–46.0)
Hemoglobin: 10 g/dL — ABNORMAL LOW (ref 12.0–15.0)
Hemoglobin: 9.7 g/dL — ABNORMAL LOW (ref 12.0–15.0)
MCH: 27.5 pg (ref 26.0–34.0)
MCH: 28.4 pg (ref 26.0–34.0)
MCHC: 33.9 g/dL (ref 30.0–36.0)
MCHC: 34.3 g/dL (ref 30.0–36.0)
MCV: 81 fL (ref 80.0–100.0)
MCV: 83 fL (ref 80.0–100.0)
Platelets: 268 10*3/uL (ref 150–400)
Platelets: 227 10*3/uL (ref 150–400)
RBC: 3.64 MIL/uL — ABNORMAL LOW (ref 3.87–5.11)
RBC: 3.41 MIL/uL — ABNORMAL LOW (ref 3.87–5.11)
RDW: 15.9 % — ABNORMAL HIGH (ref 11.5–15.5)
RDW: 16.8 % — ABNORMAL HIGH (ref 11.5–15.5)
WBC: 26.5 10*3/uL — ABNORMAL HIGH (ref 4.0–10.5)
WBC: 21.2 10*3/uL — ABNORMAL HIGH (ref 4.0–10.5)
nRBC: 4.1 % — ABNORMAL HIGH (ref 0.0–0.2)
nRBC: 5.1 % — ABNORMAL HIGH (ref 0.0–0.2)

## 2024-10-06 LAB — COMPREHENSIVE METABOLIC PANEL WITH GFR
ALT: 661 U/L — ABNORMAL HIGH (ref 0–44)
AST: 729 U/L — ABNORMAL HIGH (ref 15–41)
Albumin: 3 g/dL — ABNORMAL LOW (ref 3.5–5.0)
Alkaline Phosphatase: 76 U/L (ref 38–126)
Anion gap: 15 (ref 5–15)
BUN: 28 mg/dL — ABNORMAL HIGH (ref 8–23)
CO2: 23 mmol/L (ref 22–32)
Calcium: 7.4 mg/dL — ABNORMAL LOW (ref 8.9–10.3)
Chloride: 100 mmol/L (ref 98–111)
Creatinine, Ser: 1.07 mg/dL — ABNORMAL HIGH (ref 0.44–1.00)
GFR, Estimated: 51 mL/min — ABNORMAL LOW
Glucose, Bld: 98 mg/dL (ref 70–99)
Potassium: 3.2 mmol/L — ABNORMAL LOW (ref 3.5–5.1)
Sodium: 138 mmol/L (ref 135–145)
Total Bilirubin: 1.1 mg/dL (ref 0.0–1.2)
Total Protein: 5.7 g/dL — ABNORMAL LOW (ref 6.5–8.1)

## 2024-10-06 LAB — LACTIC ACID, PLASMA: Lactic Acid, Venous: 2.5 mmol/L (ref 0.5–1.9)

## 2024-10-06 LAB — HEMOGLOBIN AND HEMATOCRIT, BLOOD
HCT: 30.9 % — ABNORMAL LOW (ref 36.0–46.0)
Hemoglobin: 10.7 g/dL — ABNORMAL LOW (ref 12.0–15.0)

## 2024-10-06 LAB — PROTIME-INR
INR: 1.3 — ABNORMAL HIGH (ref 0.8–1.2)
Prothrombin Time: 16.8 s — ABNORMAL HIGH (ref 11.4–15.2)

## 2024-10-06 LAB — PREPARE RBC (CROSSMATCH)

## 2024-10-06 LAB — VANCOMYCIN, RANDOM: Vancomycin Rm: 4 ug/mL

## 2024-10-06 LAB — PRO BRAIN NATRIURETIC PEPTIDE: Pro Brain Natriuretic Peptide: 14421 pg/mL — ABNORMAL HIGH

## 2024-10-06 MED ORDER — METOPROLOL TARTRATE 5 MG/5ML IV SOLN
2.5000 mg | Freq: Two times a day (BID) | INTRAVENOUS | Status: DC
  Administered 2024-10-06: 2.5 mg via INTRAVENOUS
  Filled 2024-10-06: qty 5

## 2024-10-06 MED ORDER — PIPERACILLIN-TAZOBACTAM 3.375 G IVPB
3.3750 g | Freq: Three times a day (TID) | INTRAVENOUS | Status: DC
  Administered 2024-10-06 – 2024-10-07 (×3): 3.375 g via INTRAVENOUS
  Filled 2024-10-06 (×3): qty 50

## 2024-10-06 MED ORDER — AMIODARONE IV BOLUS ONLY 150 MG/100ML
150.0000 mg | Freq: Once | INTRAVENOUS | Status: AC
  Administered 2024-10-06: 150 mg via INTRAVENOUS
  Filled 2024-10-06: qty 100

## 2024-10-06 MED ORDER — VANCOMYCIN HCL 1250 MG/250ML IV SOLN
1250.0000 mg | INTRAVENOUS | Status: DC
  Administered 2024-10-06: 1250 mg via INTRAVENOUS
  Filled 2024-10-06: qty 250

## 2024-10-06 NOTE — Progress Notes (Signed)
 " PROGRESS NOTE Cindy Finley    DOB: 1941/04/03, 84 y.o.  FMW:[id number]    Code Status: Limited: Do not attempt resuscitation (DNR) -DNR-LIMITED -Do Not Intubate/DNI    DOA: 10/05/2024   LOS: 0  Brief hospital course  Cindy Finley is a 84 y.o. 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    ED Course: hemoglobin of 3.6 and hematocrit of 12.4. AST of 645 and ALT of 604.  BUN of 32 and creatinine of 1.31.  Initially received 4 units pRBCs and hgb improved. Vitals remained stable. Urology is consulted.   10/06/24 -tentatively planned for cystoscopy on Monday by urology. Had episode of afib/sinus tachy today up to 140s. She was completely asymptomatic.   Assessment & Plan  Principal Problem:   Symptomatic anemia Active Problems:   Acute hemorrhagic cystitis   Gross hematuria   Hyperlipidemia   History of colon cancer   Severe anemia   Gross hematuria  Severe symptomatic anemia from acute blood loss- hgb improved to 10 today s/p 4 units pRBC and continues to have gross hematuria. Denies dysuria.  - continue to monitor hgb.  - transfusion threshold <7 - urology following   Sepsis likely due to UTI - Cultures have been sent - Patient will be continued on Zosyn  and vancomycin  which was started in the emergency room - Follow-up cultures   Acute hypoxia due to severe anemia and fluid overload- improved to 3L [place], briefly on bipap overnight.  - Continue oxygen to maintain saturation more than 90% and wean as tolerated.  - 40 mg Lasix  as mentioned above. - Continue to monitor oxygen.  Afib/sinus tachycardia up to 140s and sustained. BP limits treatment options. Was given a bolus of amiodarone  and unclear how much was distributed intravenously as the IV infiltrated. Site being treated with heat and elevation. Needs  to be monitored closely.  - HR improved to 90s with amiodarone  bolus.  - IV low dose metoprolol  ordered with holding parameters.  - repeat ECG-  - of course if she has continued afib would have limitations of anticoagulation with active bleeding. None started at this time.    History of colorectal cancer/concern for colovesical fistula/CT scan showing bladder mass. - Defer to urology- biopsy planned for Monday   HLD - She will be continued her home dose of statin   Transaminitis - May be part of severe anemia - Will continue to monitor  Body mass index is 22.31 kg/m.  VTE ppx: SCDs Start: 10/05/24 1605  Diet:     Diet   Diet regular Room service appropriate? Yes; Fluid consistency: Thin   Consultants: Urology   Subjective 10/06/24    Pt reports feeling improved. Her shortness of breath has improved. Denies chest pain or sensation of flutter.    Objective  Blood pressure (!) 114/57, pulse 95, temperature 98.3 F (36.8 C), temperature source Core (Comment), resp. rate (!) 29, height 5' 2 (1.575 m), SpO2 98%.  Intake/Output Summary (Last 24 hours) at 10/06/2024 0755 Last data filed at 10/06/2024 9373 Gross per 24 hour  Intake 1870 ml  Output 2500 ml  Net -630 ml   Physical Exam:  General: awake, alert, NAD HEENT: atraumatic, clear conjunctiva, anicteric sclera, MMM, hearing grossly normal Respiratory: normal respiratory effort. Cardiovascular: extremities well perfused, quick capillary refill, rapid and  irregular HR. Nervous: A&O x3. no gross focal neurologic deficits, normal speech Extremities: moves all equally, no edema, normal tone Skin: dry, intact, normal temperature, normal color. No rashes, lesions or ulcers on exposed skin GU: significant hematuria.  Psychiatry: normal mood, congruent affect  Labs   I have personally reviewed the following labs and imaging studies CBC    Component Value Date/Time   WBC 25.1 (H) 10/05/2024 1105   RBC 1.46 (L)  10/05/2024 1105   HGB 10.7 (L) 10/06/2024 0010   HCT 30.9 (L) 10/06/2024 0010   PLT 423 (H) 10/05/2024 1105   MCV 84.9 10/05/2024 1105   MCH 24.7 (L) 10/05/2024 1105   MCHC 29.0 (L) 10/05/2024 1105   RDW 17.8 (H) 10/05/2024 1105   LYMPHSABS 0.9 08/22/2024 1158   MONOABS 2.0 (H) 08/22/2024 1158   EOSABS 0.0 08/22/2024 1158   BASOSABS 0.0 08/22/2024 1158      Latest Ref Rng & Units 10/06/2024    5:09 AM 10/05/2024   11:05 AM 08/23/2024    9:07 AM  BMP  Glucose 70 - 99 mg/dL 98  882  96   BUN 8 - 23 mg/dL 28  32  21   Creatinine 0.44 - 1.00 mg/dL 8.92  8.68  9.29   Sodium 135 - 145 mmol/L 138  139  137   Potassium 3.5 - 5.1 mmol/L 3.2  4.6  3.7   Chloride 98 - 111 mmol/L 100  100  106   CO2 22 - 32 mmol/L 23  10  23    Calcium  8.9 - 10.3 mg/dL 7.4  8.6  7.9     DG Chest Portable 1 View Result Date: 10/05/2024 EXAM: 1 VIEW(S) XRAY OF THE CHEST 10/05/2024 03:51:00 PM COMPARISON: 10/05/2024 at 1:03 PM. CLINICAL HISTORY: Shortness of breath. FINDINGS: LUNGS AND PLEURA: New diffuse interstitial opacities likely representing edema. No pleural effusion. No pneumothorax. HEART AND MEDIASTINUM: No acute abnormality of the cardiac and mediastinal silhouettes. BONES AND SOFT TISSUES: No acute osseous abnormality. IMPRESSION: 1. New diffuse interstitial opacities, likely representing edema. Electronically signed by: Lonni Necessary MD 10/05/2024 04:34 PM EST RP Workstation: [id number]   CT ABDOMEN PELVIS W CONTRAST Result Date: 10/05/2024 CLINICAL DATA:  Weakness, anemia, prior abnormal CT EXAM: CT ABDOMEN AND PELVIS WITH CONTRAST TECHNIQUE: Multidetector CT imaging of the abdomen and pelvis was performed using the standard protocol following bolus administration of intravenous contrast. RADIATION DOSE REDUCTION: This exam was performed according to the departmental dose-optimization program which includes automated exposure control, adjustment of the mA and/or kV according to patient size and/or  use of iterative reconstruction technique. CONTRAST:  OMNIPAQUE  IOHEXOL  300 MG/ML  SOLN COMPARISON:  08/22/2024 FINDINGS: Lower chest: Faint ground-glass opacities are seen at the lung bases, which may reflect aspiration, infection, or edema. Trace bilateral pleural effusions, left greater than right. Hepatobiliary: No focal liver abnormality is seen. No gallstones, gallbladder wall thickening, or biliary dilatation. Pancreas: Unremarkable. No pancreatic ductal dilatation or surrounding inflammatory changes. Spleen: Normal in size without focal abnormality. Adrenals/Urinary Tract: The kidneys enhance normally and symmetrically. No evidence of urinary tract calculi or obstruction. The adrenals are unremarkable. The bladder is moderately distended. Heterogeneous areas of increased attenuation are seen throughout the bladder lumen, consistent with blood products. Irregular mass along the right lateral bladder wall measuring 5.5 x 1.5 by 5.2 cm. Cystoscopy is recommended for further evaluation. Stomach/Bowel: No bowel obstruction or ileus. Prior sigmoid colon resection and reanastomosis. No bowel wall thickening or inflammatory change. Vascular/Lymphatic:  Scattered atherosclerosis of the aorta and its branches unchanged. No pathologic adenopathy. Reproductive: Uterus and bilateral adnexa are unremarkable. Other: No free fluid or free intraperitoneal gas. No abdominal wall hernia. Musculoskeletal: There are no acute or destructive bony abnormalities. Reconstructed images demonstrate no additional findings. IMPRESSION: 1. Large irregular mass along the right lateral wall the bladder, highly concerning for malignancy. Cystoscopy is recommended for further evaluation. 2. High density material throughout the bladder lumen consistent with blood products, please correlate with urinalysis. 3. Scattered bibasilar ground-glass airspace disease, which may be related to edema, infection, or inflammation. 4. Trace bilateral  pleural effusions, left greater than right. 5.  Aortic Atherosclerosis (ICD10-I70.0). Electronically Signed   By: Ozell Daring M.D.   On: 10/05/2024 16:03   DG Chest Portable 1 View Result Date: 10/05/2024 CLINICAL DATA:  Sepsis, weakness EXAM: PORTABLE CHEST 1 VIEW COMPARISON:  01/09/2010 FINDINGS: Single frontal view of the chest demonstrates an unremarkable cardiac silhouette. No acute airspace disease, effusion, or pneumothorax. Small nodules are seen at the right lung base, largest measuring 5 mm, corresponding to findings on recent abdominal CT. No acute bony abnormalities. IMPRESSION: 1. Subcentimeter pulmonary nodules at the right lung base, corresponding to prior CT findings. These are indeterminate and require close follow-up in light of bladder abnormalities observed on prior exam. 2. No acute airspace disease. Electronically Signed   By: Ozell Daring M.D.   On: 10/05/2024 14:39   Disposition Plan & Communication  Patient status: Observation  Admitted From: Home Planned disposition location: Home Anticipated discharge date: 2/2 pending clinical course  Family Communication: none at bedside    Author: Marien LITTIE Piety, DO [facility] 10/06/2024, 7:55 AM   Available by Epic secure chat 7AM-7PM. If 7PM-7AM, please contact night-coverage.  [facility] contact information found on [email or web address].  "

## 2024-10-06 NOTE — Consult Note (Signed)
 Pharmacy Antibiotic Note  ASSESSMENT: 84 y.o. female with PMH including rUTI is presenting with severe anemia in setting of hematuria. Patient with history of Enterococcus faecalis in urine in Dec 2025. Pharmacy has been consulted to manage vancomycin  dosing. They are also receiving Zosyn . Patient had unstable renal function at admission due to AKI.  1/31 @ 1539: 16mcg/mL (predicted Cmax from loading dose = 25mcg/mL, administered ~1400 yesterday) --Scr improved but still above baseline. Patient clearing vanc; will initiate scheduled vancomycin   PLAN: Initiate vancomycin  1250mg  IV q48H eAUC 522, Cmax 41, Cmin 10 Scr 1.07, IBW, Vd 0.72 L/kg Monitor renal function and vancomycin  levels as clinically indicated for further dosing Follow up culture results to assess for antibiotic optimization.  Patient measurements: Height: 5' 2 (157.5 cm) IBW/kg (Calculated) : 50.1  Vital signs: Temp: 98.5 F (36.9 C) (01/31 0912) Temp Source: Oral (01/31 0912) BP: 114/66 (01/31 1330) Pulse Rate: 96 (01/31 1330) Recent Labs  Lab 10/05/24 1105 10/06/24 0509 10/06/24 0810 10/06/24 1539  WBC 25.1*  --  26.5* 21.2*  CREATININE 1.31* 1.07*  --   --    Estimated Creatinine Clearance: 31 mL/min (A) (by C-G formula based on SCr of 1.07 mg/dL (H)).  Allergies: Allergies[1]  Antimicrobials this admission: 1/30 Zosyn  >> 1/30 Vancomycin  >>  Dose adjustments this admission: n/a  Microbiology results: 1/30 BCx: No growth, <24 hrs 1.30 UCx: in process  Thank you for allowing pharmacy to be a part of this patients care.  Will M. Lenon, PharmD, BCPS Clinical Pharmacist 10/06/2024 4:39 PM     [1] No Known Allergies

## 2024-10-06 NOTE — ED Notes (Signed)
 Pt received amiodarone  bolus (see MAR). Pt called out due to pump beeping and stated IV site was painful. Upon assessment, IV site found to have infiltrated. Pharmacy called for recommendations and Lenon, MD, made aware. Pt arm elevated.

## 2024-10-07 LAB — CBC
HCT: 30.6 % — ABNORMAL LOW (ref 36.0–46.0)
Hemoglobin: 10.1 g/dL — ABNORMAL LOW (ref 12.0–15.0)
MCH: 27.7 pg (ref 26.0–34.0)
MCHC: 33 g/dL (ref 30.0–36.0)
MCV: 83.8 fL (ref 80.0–100.0)
Platelets: 248 10*3/uL (ref 150–400)
RBC: 3.65 MIL/uL — ABNORMAL LOW (ref 3.87–5.11)
RDW: 16.8 % — ABNORMAL HIGH (ref 11.5–15.5)
WBC: 19.3 10*3/uL — ABNORMAL HIGH (ref 4.0–10.5)
nRBC: 1 % — ABNORMAL HIGH (ref 0.0–0.2)

## 2024-10-07 LAB — URINE CULTURE: Culture: <10000 — AB

## 2024-10-07 LAB — BASIC METABOLIC PANEL WITH GFR
Anion gap: 8 (ref 5–15)
BUN: 19 mg/dL (ref 8–23)
CO2: 27 mmol/L (ref 22–32)
Calcium: 7.8 mg/dL — ABNORMAL LOW (ref 8.9–10.3)
Chloride: 99 mmol/L (ref 98–111)
Creatinine, Ser: 0.78 mg/dL (ref 0.44–1.00)
GFR, Estimated: >60 mL/min
Glucose, Bld: 104 mg/dL — ABNORMAL HIGH (ref 70–99)
Potassium: 3.4 mmol/L — ABNORMAL LOW (ref 3.5–5.1)
Sodium: 135 mmol/L (ref 135–145)

## 2024-10-07 MED ORDER — FUROSEMIDE 10 MG/ML IJ SOLN
40.0000 mg | Freq: Once | INTRAMUSCULAR | Status: AC
  Administered 2024-10-07: 40 mg via INTRAVENOUS
  Filled 2024-10-07: qty 4

## 2024-10-07 MED ORDER — ORAL CARE MOUTH RINSE: 15.0000 mL | Freq: Once | OROMUCOSAL | Status: AC

## 2024-10-07 MED ORDER — LACTATED RINGERS IV SOLN: INTRAVENOUS | Status: DC

## 2024-10-07 MED ORDER — METOPROLOL TARTRATE 25 MG PO TABS
12.5000 mg | ORAL_TABLET | Freq: Two times a day (BID) | ORAL | Status: DC
  Administered 2024-10-07 – 2024-10-11 (×9): 12.5 mg via ORAL
  Filled 2024-10-07 (×9): qty 1

## 2024-10-07 MED ORDER — SODIUM CHLORIDE 0.9 % IV SOLN
1.0000 g | INTRAVENOUS | Status: AC
  Administered 2024-10-08: 1 g via INTRAVENOUS
  Filled 2024-10-07 (×2): qty 1000

## 2024-10-07 MED ORDER — CHLORHEXIDINE GLUCONATE 0.12 % MT SOLN
15.0000 mL | Freq: Once | OROMUCOSAL | Status: AC
  Administered 2024-10-07: 15 mL via OROMUCOSAL
  Filled 2024-10-07: qty 15

## 2024-10-07 NOTE — ED Notes (Signed)
Pt assisted to bathroom with 2 person assist.  

## 2024-10-07 NOTE — Progress Notes (Signed)
" °  PROGRESS NOTE    Cindy Finley  FMW:[id number] DOB: 13-Jul-1941 DOA: 10/05/2024 PCP: [name], MD  [id number]  LOS: 1 day   Brief hospital course:   Assessment & Plan: Cindy Finley is a 84 y.o. 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     Gross hematuria  Bladder mass  --cystoscopy and biopsy tomorrow  Severe symptomatic anemia from acute blood loss --Hgb 3.6 on presentation.  improved to 10 today s/p 4 units pRBC  --monitor Hgb and transfuse to keep Hgb >7   Sepsis, ruled out UTI ruled out --urine cx insignificant growth --d/c Vanc and zosyn    Acute hypoxia due to severe anemia and fluid overload - briefly on bipap overnight. Improved to 2L today. --IV lasix  40 x1 today  --Continue supplemental O2 to keep sats >=90%, wean as tolerated  Afib/sinus tachycardia u --Was given a bolus of amiodarone  and unclear how much was distributed intravenously as the IV infiltrated.  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 - Defer to urology- biopsy planned for Monday   Transaminitis - May be part of severe anemia - Will continue to monitor   DVT prophylaxis: SCD/Compression stockings Code Status: DNR  Family Communication:  Level of care: Med-Surg Dispo:   The patient is from: home Anticipated d/c is to: home Anticipated d/c date is: 2-3 days   Subjective and Interval History:  Pt reported dyspnea much improved.     Objective: Vitals:   10/07/24 1440 10/07/24 1500 10/07/24 1600 10/07/24 1725  BP:  129/64 123/64 135/67  Pulse:  97 96 (!) 104  Resp:  (!) 24 (!) 22 20  Temp: 98.9 F (37.2 C)   98 F (36.7 C)  TempSrc: Oral     SpO2:  92% 98% 100%  Weight:      Height:         Intake/Output Summary (Last 24 hours) at 10/07/2024 1728 Last data filed at 10/07/2024 1442 Gross per 24 hour  Intake --  Output 2125 ml  Net -2125 ml   Filed Weights   10/06/24 1919  Weight: 55.3 kg    Examination:   Constitutional: NAD, AAOx3 HEENT: conjunctivae and lids normal, EOMI CV: No cyanosis.   RESP: normal respiratory effort, on 2L Neuro: II - XII grossly intact.   Psych: Normal mood and affect.  Appropriate judgement and reason Foley present with light pink urine   Data Reviewed: I have personally reviewed labs and imaging studies  Time spent: 50 minutes  Cindy Haber, MD [facility] If 7PM-7AM, please contact night-coverage 10/07/2024, 5:28 PM   "

## 2024-10-07 NOTE — ED Notes (Addendum)
 Pt was complaining of feeling the need to urinate but stated she felt like she couldn't. Bladder scan was performed and 213 mL were seen on the scanner. Catheter irrigated and urine flowing at this time. MD Awanda notified and aware.

## 2024-10-08 ENCOUNTER — Inpatient Hospital Stay

## 2024-10-08 ENCOUNTER — Ambulatory Visit: Admission: RE | Admit: 2024-10-08 | Admitting: Urology

## 2024-10-08 ENCOUNTER — Inpatient Hospital Stay: Payer: Self-pay | Admitting: Urgent Care

## 2024-10-08 ENCOUNTER — Encounter: Admission: EM | Disposition: A | Payer: Self-pay | Source: Home / Self Care | Attending: Hospitalist

## 2024-10-08 ENCOUNTER — Other Ambulatory Visit: Payer: Self-pay

## 2024-10-08 ENCOUNTER — Encounter: Payer: Self-pay | Admitting: Hospitalist

## 2024-10-08 DIAGNOSIS — D649 Anemia, unspecified: Secondary | ICD-10-CM | POA: Diagnosis not present

## 2024-10-08 DIAGNOSIS — R310 Gross hematuria: Secondary | ICD-10-CM | POA: Diagnosis not present

## 2024-10-08 DIAGNOSIS — C679 Malignant neoplasm of bladder, unspecified: Secondary | ICD-10-CM | POA: Diagnosis not present

## 2024-10-08 LAB — SURGICAL PCR SCREEN
MRSA, PCR: NEGATIVE
Staphylococcus aureus: NEGATIVE

## 2024-10-08 MED ORDER — SUGAMMADEX SODIUM 200 MG/2ML IV SOLN
INTRAVENOUS | Status: DC | PRN
  Administered 2024-10-08: 300 mg via INTRAVENOUS

## 2024-10-08 MED ORDER — FENTANYL CITRATE (PF) 100 MCG/2ML IJ SOLN: 25.0000 ug | INTRAMUSCULAR | Status: DC | PRN

## 2024-10-08 MED ORDER — ONDANSETRON HCL 4 MG/2ML IJ SOLN: 4.0000 mg | Freq: Once | INTRAMUSCULAR | Status: DC | PRN

## 2024-10-08 MED ORDER — POTASSIUM CHLORIDE CRYS ER 20 MEQ PO TBCR: 40.0000 meq | EXTENDED_RELEASE_TABLET | Freq: Once | ORAL | Status: DC

## 2024-10-08 MED ORDER — PROPOFOL 10 MG/ML IV BOLUS
INTRAVENOUS | Status: DC | PRN
  Administered 2024-10-08: 100 mg via INTRAVENOUS

## 2024-10-08 MED ORDER — PHENYLEPHRINE 80 MCG/ML (10ML) SYRINGE FOR IV PUSH (FOR BLOOD PRESSURE SUPPORT)
PREFILLED_SYRINGE | INTRAVENOUS | Status: DC | PRN
  Administered 2024-10-08: 160 ug via INTRAVENOUS

## 2024-10-08 MED ORDER — LIDOCAINE HCL (CARDIAC) PF 100 MG/5ML IV SOSY
PREFILLED_SYRINGE | INTRAVENOUS | Status: DC | PRN
  Administered 2024-10-08: 70 mg via INTRAVENOUS

## 2024-10-08 MED ORDER — STERILE WATER FOR IRRIGATION IR SOLN
Status: DC | PRN
  Administered 2024-10-08: 1000 mL

## 2024-10-08 MED ORDER — FENTANYL CITRATE (PF) 100 MCG/2ML IJ SOLN
INTRAMUSCULAR | Status: AC
  Filled 2024-10-08: qty 2

## 2024-10-08 MED ORDER — DEXAMETHASONE SOD PHOSPHATE PF 10 MG/ML IJ SOLN
INTRAMUSCULAR | Status: DC | PRN
  Administered 2024-10-08: 10 mg via INTRAVENOUS

## 2024-10-08 MED ORDER — ACETAMINOPHEN 10 MG/ML IV SOLN
INTRAVENOUS | Status: DC | PRN
  Administered 2024-10-08: 1000 mg via INTRAVENOUS

## 2024-10-08 MED ORDER — OXYCODONE HCL 5 MG PO TABS: 5.0000 mg | ORAL_TABLET | Freq: Once | ORAL | Status: DC | PRN

## 2024-10-08 MED ORDER — SODIUM CHLORIDE 0.9 % IR SOLN
Status: DC | PRN
  Administered 2024-10-08: 3000 mL
  Administered 2024-10-08 (×2): 6000 mL
  Administered 2024-10-08: 3000 mL
  Administered 2024-10-08: 6000 mL

## 2024-10-08 MED ORDER — ROCURONIUM BROMIDE 100 MG/10ML IV SOLN
INTRAVENOUS | Status: DC | PRN
  Administered 2024-10-08: 10 mg via INTRAVENOUS
  Administered 2024-10-08: 50 mg via INTRAVENOUS

## 2024-10-08 MED ORDER — EPHEDRINE SULFATE-NACL 50-0.9 MG/10ML-% IV SOSY
PREFILLED_SYRINGE | INTRAVENOUS | Status: DC | PRN
  Administered 2024-10-08: 5 mg via INTRAVENOUS

## 2024-10-08 MED ORDER — PROPOFOL 1000 MG/100ML IV EMUL
INTRAVENOUS | Status: AC
  Filled 2024-10-08: qty 100

## 2024-10-08 MED ORDER — EPHEDRINE 5 MG/ML INJ
INTRAVENOUS | Status: AC
  Filled 2024-10-08: qty 5

## 2024-10-08 MED ORDER — FENTANYL CITRATE (PF) 100 MCG/2ML IJ SOLN
INTRAMUSCULAR | Status: DC | PRN
  Administered 2024-10-08: 75 ug via INTRAVENOUS
  Administered 2024-10-08: 25 ug via INTRAVENOUS

## 2024-10-08 MED ORDER — OXYCODONE HCL 5 MG/5ML PO SOLN: 5.0000 mg | Freq: Once | ORAL | Status: DC | PRN

## 2024-10-08 NOTE — Progress Notes (Signed)
" °  PROGRESS NOTE    Cindy Finley  FMW:[id number] DOB: [birth year] DOA: 10/05/2024 PCP: [name], MD  [id number]  LOS: 2 days   Brief hospital course:   Assessment & Plan: Cindy Finley is a 84 y.o. 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     Gross hematuria  Bladder mass  --cystoscopy and biopsy today --Need to keep foley in for 3 days  Severe symptomatic anemia from acute blood loss --Hgb 3.6 on presentation.  improved to 10 next day s/p 4 units pRBC  --monitor Hgb   Sepsis, ruled out UTI ruled out --urine cx insignificant growth --d/c'ed Vanc and zosyn    Acute hypoxia due to severe anemia and fluid overload - briefly on bipap overnight. Improved to 2L next day. --Continue supplemental O2 to keep sats >=90%, wean as tolerated  Afib/sinus tachycardia --Was given a bolus of amiodarone  and unclear how much was distributed intravenously as the IV infiltrated.  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 - Defer to urology- biopsy planned for Monday   Transaminitis - May be part of severe anemia - Will continue to monitor   DVT prophylaxis: SCD/Compression stockings Code Status: DNR  Family Communication:  Level of care: Med-Surg Dispo:   The patient is from: home Anticipated d/c is to: home Anticipated d/c date is: 2-3 days   Subjective and Interval History:  Pt underwent cystoscopy today with resection of tumor mass.   Objective: Vitals:   10/08/24 1250 10/08/24 1255 10/08/24 1300 10/08/24 1552  BP:   119/63 (!) 111/54  Pulse: 78 80 85 92  Resp: 17 19 20 17   Temp:  (!) 97.4 F (36.3 C) 97.7 F (36.5 C) 97.9 F (36.6 C)  TempSrc:   Oral   SpO2: 93% 91% 91% 91%  Weight:       Height:        Intake/Output Summary (Last 24 hours) at 10/08/2024 1959 Last data filed at 10/08/2024 1927 Gross per 24 hour  Intake 993.18 ml  Output 950 ml  Net 43.18 ml   Filed Weights   10/06/24 1919 10/08/24 0952  Weight: 55.3 kg 55.3 kg    Examination:   Constitutional: NAD, AAOx3 HEENT: conjunctivae and lids normal, EOMI CV: No cyanosis.   RESP: normal respiratory effort Neuro: II - XII grossly intact.   Psych: Normal mood and affect.    Foley present with light pink urine   Data Reviewed: I have personally reviewed labs and imaging studies  Time spent: 35 minutes  Ellouise Haber, MD [facility] If 7PM-7AM, please contact night-coverage 10/08/2024, 7:59 PM   "

## 2024-10-08 NOTE — Op Note (Addendum)
 Date of procedure: 10/08/24  Preoperative diagnosis:  Bladder tumor Gross hematuria  Postoperative diagnosis:  Same  Procedure: Transurethral resection of bladder tumor(large, >5 cm)  Surgeon: Redell Burnet, MD  Anesthesia: General  Complications: None  Intraoperative findings:  Ureteral orifices orthotopic bilaterally, no evidence of fistula Large, irregular, bullous bladder tumor right lateral wall wrapping towards the dome and posteriorly  EBL: Minimal  Specimens: Bladder tumor  Drains: 24 French two-way Foley  Indication: [name] is a 84 y.o. patient with gross hematuria and UTIs found to have large bladder tumor on CT, here today for TURBT.  After reviewing the management options for treatment, they elected to proceed with the above surgical procedure(s). We have discussed the potential benefits and risks of the procedure, side effects of the proposed treatment, the likelihood of the patient achieving the goals of the procedure, and any potential problems that might occur during the procedure or recuperation. Informed consent has been obtained.  Description of procedure:  The patient was taken to the operating room and general anesthesia was induced. SCDs were placed for DVT prophylaxis. The patient was placed in the dorsal lithotomy position, prepped and draped in the usual sterile fashion, and preoperative antibiotics were administered. A preoperative time-out was performed.   A 26 French resectoscope with visual obturator was used to intubate the urethra.  Vision was very poor and there was fairly significant debris/old clot throughout the bladder.  This was irrigated free using a Toomey syringe until vision improved.  Once I could see clearly the ureteral orifices did appear to be orthotopic bilaterally.  There was a large, at least 8 cm bullous and abnormal appearing bladder tumor on the right lateral wall that wrapped anteriorly to the dome, as well as posteriorly  to the back of the bladder.  The ureteral orifices were not involved with tumor.  A large bipolar resecting loop was used to methodically resect abnormal tissue down to the level of the bladder wall.  Tumor was extensive, high suspicion for muscle invasion.  High suspicion for residual tumor, did not feel safe to resect more extensively with high risk for bladder perforation and suspicion for muscle invasion.  The bipolar button was then used to achieve meticulous hemostasis throughout the tumor bed.  With the bladder decompressed no bleeding was noted.  Bladder tumor chips were sent for pathology.  A 24 French two-way Foley passed easily with return of clear fluid, 10 mL were placed in the balloon.  The catheter irrigated easily with saline and remained crystal-clear.  Disposition: Stable to PACU  Plan: Return to floor, she is not able to take care of catheter at home and likely will need to be admitted the next few days.  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  Anticipate Foley catheter at least 3 to 5 days for bladder healing in the setting of extensive resection.  Redell Burnet, MD

## 2024-10-08 NOTE — Anesthesia Procedure Notes (Addendum)
 Procedure Name: Intubation Date/Time: 10/08/2024 10:39 AM  Performed by: Ledora Duncan, CRNAPre-anesthesia Checklist: Patient identified, Emergency Drugs available, Suction available and Patient being monitored Patient Re-evaluated:Patient Re-evaluated prior to induction Oxygen Delivery Method: Circle system utilized Preoxygenation: Pre-oxygenation with 100% oxygen Induction Type: IV induction Ventilation: Mask ventilation without difficulty Laryngoscope Size: McGrath and 3 Grade View: Grade II Tube type: Oral Tube size: 7.0 mm Number of attempts: 1 Airway Equipment and Method: Stylet and Oral airway Placement Confirmation: ETT inserted through vocal cords under direct vision, positive ETCO2 and breath sounds checked- equal and bilateral Secured at: 21 cm Tube secured with: Tape Dental Injury: Teeth and Oropharynx as per pre-operative assessment

## 2024-10-08 NOTE — Progress Notes (Signed)
 Patient back from cystoscopy , she is alert and oriented, able to make needs known, ordered something to eat. Foley intact and draining  10/08/24 1300  Vitals  Temp 97.7 F (36.5 C)  Temp Source Oral  BP 119/63  MAP (mmHg) 79  Pulse Rate 85  Resp 20  Level of Consciousness  Level of Consciousness Alert  MEWS COLOR  MEWS Score Color Green  Oxygen Therapy  SpO2 91 %  O2 Device Room Air  MEWS Score  MEWS Temp 0  MEWS Systolic 0  MEWS Pulse 0  MEWS RR 0  MEWS LOC 0  MEWS Score 0

## 2024-10-08 NOTE — Plan of Care (Signed)

## 2024-10-08 NOTE — Transfer of Care (Signed)
 Immediate Anesthesia Transfer of Care Note  Patient: Cindy Finley  Procedure(s) Performed: TURBT (TRANSURETHRAL RESECTION OF BLADDER TUMOR) (Bladder)  Patient Location: PACU  Anesthesia Type:General  Level of Consciousness: awake, drowsy, and patient cooperative  Airway & Oxygen Therapy: Patient Spontanous Breathing and Patient connected to face mask oxygen  Post-op Assessment: Report given to RN and Post -op Vital signs reviewed and stable  Post vital signs: Reviewed and stable  Last Vitals:  Vitals Value Taken Time  BP 122/64 10/08/24 11:55  Temp    Pulse 78 10/08/24 11:58  Resp    SpO2 100 % 10/08/24 11:58  Vitals shown include unfiled device data.  Last Pain:  Vitals:   10/08/24 0952  TempSrc: Temporal  PainSc: 0-No pain      Patients Stated Pain Goal: 0 (10/05/24 1913)  Complications: No notable events documented.

## 2024-10-09 ENCOUNTER — Encounter: Payer: Self-pay | Admitting: Urology

## 2024-10-09 DIAGNOSIS — D494 Neoplasm of unspecified behavior of bladder: Secondary | ICD-10-CM

## 2024-10-09 DIAGNOSIS — D649 Anemia, unspecified: Secondary | ICD-10-CM | POA: Diagnosis not present

## 2024-10-09 DIAGNOSIS — C679 Malignant neoplasm of bladder, unspecified: Secondary | ICD-10-CM | POA: Diagnosis not present

## 2024-10-09 LAB — TYPE AND SCREEN
ABO/RH(D): O NEG
Antibody Screen: NEGATIVE
Unit division: 0
Unit division: 0
Unit division: 0
Unit division: 0

## 2024-10-09 LAB — BPAM RBC
Blood Product Expiration Date: 202602092359
Blood Product Expiration Date: 202602092359
Blood Product Expiration Date: 202602112359
Blood Product Expiration Date: 202602112359
ISSUE DATE / TIME: 202601301418
ISSUE DATE / TIME: 202601301529
ISSUE DATE / TIME: 202601302020
Unit Type and Rh: 9500
Unit Type and Rh: 9500
Unit Type and Rh: 9500
Unit Type and Rh: 9500

## 2024-10-09 LAB — CBC
HCT: 29.2 % — ABNORMAL LOW (ref 36.0–46.0)
Hemoglobin: 9.4 g/dL — ABNORMAL LOW (ref 12.0–15.0)
MCH: 27.6 pg (ref 26.0–34.0)
MCHC: 32.2 g/dL (ref 30.0–36.0)
MCV: 85.6 fL (ref 80.0–100.0)
Platelets: 238 10*3/uL (ref 150–400)
RBC: 3.41 MIL/uL — ABNORMAL LOW (ref 3.87–5.11)
RDW: 17.3 % — ABNORMAL HIGH (ref 11.5–15.5)
WBC: 14.4 10*3/uL — ABNORMAL HIGH (ref 4.0–10.5)
nRBC: 0.6 % — ABNORMAL HIGH (ref 0.0–0.2)

## 2024-10-09 LAB — COMPREHENSIVE METABOLIC PANEL WITH GFR
ALT: 292 U/L — ABNORMAL HIGH (ref 0–44)
AST: 76 U/L — ABNORMAL HIGH (ref 15–41)
Albumin: 2.9 g/dL — ABNORMAL LOW (ref 3.5–5.0)
Alkaline Phosphatase: 98 U/L (ref 38–126)
Anion gap: 10 (ref 5–15)
BUN: 16 mg/dL (ref 8–23)
CO2: 26 mmol/L (ref 22–32)
Calcium: 8.2 mg/dL — ABNORMAL LOW (ref 8.9–10.3)
Chloride: 100 mmol/L (ref 98–111)
Creatinine, Ser: 0.63 mg/dL (ref 0.44–1.00)
GFR, Estimated: >60 mL/min
Glucose, Bld: 129 mg/dL — ABNORMAL HIGH (ref 70–99)
Potassium: 3.8 mmol/L (ref 3.5–5.1)
Sodium: 136 mmol/L (ref 135–145)
Total Bilirubin: 0.5 mg/dL (ref 0.0–1.2)
Total Protein: 5.7 g/dL — ABNORMAL LOW (ref 6.5–8.1)

## 2024-10-09 LAB — SURGICAL PATHOLOGY

## 2024-10-09 LAB — MAGNESIUM: Magnesium: 2.4 mg/dL (ref 1.7–2.4)

## 2024-10-09 NOTE — Care Management Important Message (Signed)
 Important Message  Patient Details  Name: Cindy Finley MRN: [id number] Date of Birth: 1941-01-29   Important Message Given:  Yes - Medicare IM     [name] 10/09/2024, 1:54 PM

## 2024-10-09 NOTE — Progress Notes (Signed)
" °  PROGRESS NOTE    Cindy Finley  FMW:[id number] DOB: 07-07-1941 DOA: 10/05/2024 PCP: Cindy Lame, MD  [id number]  LOS: 3 days   Brief hospital course:   Assessment & Plan: Cindy Finley is a 84 y.o. 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     Gross hematuria  Bladder mass  --cystoscopy and biopsy on 2/2 --Need to keep foley in for 3 days --f/u biopsy result  Severe symptomatic anemia from acute blood loss --Hgb 3.6 on presentation.  improved to 10 next day s/p 4 units pRBC  --monitor Hgb   Sepsis, ruled out UTI ruled out --urine cx insignificant growth --d/c'ed Vanc and zosyn    Acute hypoxia due to severe anemia and fluid overload - briefly on bipap overnight. Improved to 2L next day.  Now weaned down to RA.  Afib/sinus tachycardia --Was given a bolus of amiodarone  and unclear how much was distributed intravenously as the IV infiltrated.  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 - Defer to urology- biopsy planned for Monday   Transaminitis - May be part of severe anemia - Will continue to monitor   DVT prophylaxis: SCD/Compression stockings Code Status: DNR  Family Communication:  Level of care: Med-Surg Dispo:   The patient is from: home Anticipated d/c is to: home Anticipated d/c date is: 2-3 days   Subjective and Interval History:  Pt reported no dyspnea.  Hematuria resolved.   Objective: Vitals:   10/08/24 2028 10/09/24 0414 10/09/24 0848 10/09/24 1500  BP: (!) 125/100 111/62 122/62 (!) 128/58  Pulse: 98 86 87 100  Resp: 18 16 18 18   Temp: 97.9 F (36.6 C) 97.7 F (36.5 C) 98 F (36.7 C) 98 F (36.7 C)  TempSrc: Oral Oral Oral Oral  SpO2: 95% 94% 94% 95%  Weight:       Height:        Intake/Output Summary (Last 24 hours) at 10/09/2024 1944 Last data filed at 10/09/2024 1700 Gross per 24 hour  Intake 480 ml  Output 1100 ml  Net -620 ml   Filed Weights   10/06/24 1919 10/08/24 0952  Weight: 55.3 kg 55.3 kg    Examination:   Constitutional: NAD, AAOx3 HEENT: conjunctivae and lids normal, EOMI CV: No cyanosis.   RESP: normal respiratory effort, on RA Neuro: II - XII grossly intact.   Psych: Normal mood and affect.  Appropriate judgement and reason  Foley present without hematuria.   Data Reviewed: I have personally reviewed labs and imaging studies  Time spent: 35 minutes  Cindy Haber, MD [facility] If 7PM-7AM, please contact night-coverage 10/09/2024, 7:44 PM   "

## 2024-10-10 ENCOUNTER — Other Ambulatory Visit: Payer: Self-pay | Admitting: Urology

## 2024-10-10 ENCOUNTER — Encounter: Payer: Self-pay | Admitting: Hospitalist

## 2024-10-10 DIAGNOSIS — N3001 Acute cystitis with hematuria: Secondary | ICD-10-CM

## 2024-10-10 DIAGNOSIS — D649 Anemia, unspecified: Secondary | ICD-10-CM | POA: Diagnosis not present

## 2024-10-10 DIAGNOSIS — C672 Malignant neoplasm of lateral wall of bladder: Secondary | ICD-10-CM

## 2024-10-10 DIAGNOSIS — C679 Malignant neoplasm of bladder, unspecified: Secondary | ICD-10-CM

## 2024-10-10 DIAGNOSIS — Z85038 Personal history of other malignant neoplasm of large intestine: Secondary | ICD-10-CM

## 2024-10-10 LAB — CBC
HCT: 30.1 % — ABNORMAL LOW (ref 36.0–46.0)
Hemoglobin: 9.4 g/dL — ABNORMAL LOW (ref 12.0–15.0)
MCH: 27.3 pg (ref 26.0–34.0)
MCHC: 31.2 g/dL (ref 30.0–36.0)
MCV: 87.5 fL (ref 80.0–100.0)
Platelets: 237 10*3/uL (ref 150–400)
RBC: 3.44 MIL/uL — ABNORMAL LOW (ref 3.87–5.11)
RDW: 17.9 % — ABNORMAL HIGH (ref 11.5–15.5)
WBC: 13.5 10*3/uL — ABNORMAL HIGH (ref 4.0–10.5)
nRBC: 0.1 % (ref 0.0–0.2)

## 2024-10-10 LAB — CULTURE, BLOOD (ROUTINE X 2)
Culture: NO GROWTH
Culture: NO GROWTH
Special Requests: ADEQUATE
Special Requests: ADEQUATE

## 2024-10-10 MED ORDER — CALCIUM POLYCARBOPHIL 625 MG PO TABS
625.0000 mg | ORAL_TABLET | Freq: Two times a day (BID) | ORAL | Status: DC
  Administered 2024-10-10 – 2024-10-11 (×3): 625 mg via ORAL
  Filled 2024-10-10 (×5): qty 1

## 2024-10-10 NOTE — Consult Note (Signed)
 "  Hematology/Oncology Consult note [phone] [phone]      Patient Care Team: [name], MD as PCP - General (Family Medicine)   Name of the patient: Cindy Finley  [id number]  09-01-1941   REASON FOR COSULTATION:  Muscle invasive bladder cancer History of presenting illness-  84 y.o. female with PMH listed at below who presents to ER for evaluation of hematuria, with recurrent UTI, severe anemia.  08/22/2024 - 08/25/2024 patient presented to ER. Patient reports about several weeks of gross hematuria and recommended UTI.   Urine culture grew Enterococcus and patient was treated with culture appropriate antibiotics.  Patient was transfused and followed up outpatient with urology.    Outpatient cystoscopy done on 09/26/2024 revealed limited findings due to brown-colored urine.  10/05/2024, patient presented to ER for evaluation of recurrent hematuria with outpatient hemoglobin of 3.6.  Elevation of AST and ALT.  Creatinine 1.3 with BUN of 32.  Denies nausea vomiting diarrhea melena blood in the stool.  Patient received blood transfusion. 10/05/2024, CT abdomen pelvis with contrast showed large irregular mass along the right lateral wall of the bladder, highly concerning for malignancy.  High density material throughout the bladder lumen consistent with blood products.  Scattered bibasilar ground glass airspace disease which may be related to edema, infection or inflammation.  Trace bilateral pleural effusion.  Left greater than right.  Aortic atherosclerosis.  10/08/2024, cystoscopy showed large irregular bolus bladder tumor at right lateral wall, biopsy towards the dome and posteriorly.  Status post transurethral resection of this bladder tumor. 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  5%.  Carcinoma invades lamina propria and muscularis propria.  Carcinoma in situ is not identified.  Lymphovascular invasion is  not identified.  Perineural invasion identified.  Oncology was consulted for further evaluation management.  Patient reports a remote history of colorectal cancer status postsurgery, chemotherapy and radiation around 2002.  She also had a skin cancer and has received radiation by Dr. Camelia. She has a twin sister with a history of breast cancer.  She lives at home by herself.  Allergies[1]  Patient Active Problem List   Diagnosis Date Noted   Bladder tumor 10/09/2024   Symptomatic anemia 10/05/2024   Severe anemia 10/05/2024   Acute hemorrhagic cystitis 08/22/2024   Gross hematuria 08/22/2024   Hyperlipidemia 08/22/2024   History of colon cancer 08/22/2024     Past Medical History:  Diagnosis Date   Anemia    Aortic atherosclerosis    Basal cell carcinoma 10/30/2020   R buccal cheek    Colon cancer (HCC) 2001   chemo and radiation   HLD (hyperlipidemia)    Insomnia    a.) takes melatonin PRN   Personal history of chemotherapy    colon ca   Personal history of radiation therapy    colon ca   Pulmonary nodules      Past Surgical History:  Procedure Laterality Date   BREAST BIOPSY Right 02/14/2017   benign   COLON SURGERY     TRANSURETHRAL RESECTION OF BLADDER TUMOR N/A 10/08/2024   Procedure: TURBT (TRANSURETHRAL RESECTION OF BLADDER TUMOR);  Surgeon: [name], MD;  Location: [facility] ORS;  Service: Urology;  Laterality: N/A;    Social History   Socioeconomic History   Marital status: Widowed    Spouse name: Not on file   Number of children: Not on file   Years of education: Not on file   Highest education level: Not on  file  Occupational History   Not on file  Tobacco Use   Smoking status: Former   Smokeless tobacco: Never   Tobacco comments:    quit around 84 years old  Vaping Use   Vaping status: Never Used  Substance and Sexual Activity   Alcohol use: Not on file   Drug use: Not on file   Sexual activity: Not on file  Other Topics Concern    Not on file  Social History Narrative   Not on file   Social Drivers of Health   Tobacco Use: Medium Risk (10/08/2024)   Patient History    Smoking Tobacco Use: Former    Smokeless Tobacco Use: Never    Passive Exposure: Not on file  Financial Resource Strain: Low Risk  (02/21/2024)   Received from [facility]   Overall Financial Resource Strain (CARDIA)    Difficulty of Paying Living Expenses: Not hard at all  Food Insecurity: No Food Insecurity (10/07/2024)   Epic    Worried About Radiation Protection Practitioner of Food in the Last Year: Never true    Ran Out of Food in the Last Year: Never true  Transportation Needs: No Transportation Needs (10/07/2024)   Epic    Lack of Transportation (Medical): No    Lack of Transportation (Non-Medical): No  Physical Activity: Not on file  Stress: Not on file  Social Connections: Patient Declined (10/07/2024)   Social Connection and Isolation Panel    Frequency of Communication with Friends and Family: Patient declined    Frequency of Social Gatherings with Friends and Family: Patient declined    Attends Religious Services: Patient declined    Database Administrator or Organizations: Patient declined    Attends Banker Meetings: Patient declined    Marital Status: Patient declined  Intimate Partner Violence: Not At Risk (10/07/2024)   Epic    Fear of Current or Ex-Partner: No    Emotionally Abused: No    Physically Abused: No    Sexually Abused: No  Depression (PHQ2-9): Not on file  Alcohol Screen: Not on file  Housing: Low Risk (10/07/2024)   Epic    Unable to Pay for Housing in the Last Year: No    Number of Times Moved in the Last Year: 0    Homeless in the Last Year: No  Utilities: Not At Risk (10/07/2024)   Epic    Threatened with loss of utilities: No  Health Literacy: Not on file     Family History  Problem Relation Age of Onset   Breast cancer Sister 76    Current Medications[2]  Review of Systems  Constitutional:   Positive for fatigue. Negative for appetite change, chills and fever.  HENT:   Negative for hearing loss and voice change.   Eyes:  Negative for eye problems.  Respiratory:  Negative for chest tightness and cough.   Cardiovascular:  Negative for chest pain.  Gastrointestinal:  Negative for abdominal distention, abdominal pain and blood in stool.  Endocrine: Negative for hot flashes.  Genitourinary:  Positive for hematuria. Negative for frequency.   Musculoskeletal:  Negative for arthralgias.  Skin:  Negative for itching and rash.  Neurological:  Negative for extremity weakness.  Hematological:  Negative for adenopathy.  Psychiatric/Behavioral:  Negative for confusion.     PHYSICAL EXAM Vitals:   10/09/24 2041 10/10/24 0455 10/10/24 0804 10/10/24 1942  BP: 130/60 119/63 124/60 122/61  Pulse: 99 89 92 100  Resp: 17 16  16  Temp: 98.6 F (37 C) 97.9 F (36.6 C) 97.6 F (36.4 C) (!) 97.5 F (36.4 C)  TempSrc:  Oral Oral Oral  SpO2: 93% 91% 95% 96%  Weight:      Height:       Physical Exam Constitutional:      Appearance: Normal appearance. She is not diaphoretic.  HENT:     Head: Normocephalic and atraumatic.  Eyes:     General: No scleral icterus. Cardiovascular:     Rate and Rhythm: Normal rate and regular rhythm.  Pulmonary:     Effort: Pulmonary effort is normal. No respiratory distress.     Breath sounds: Normal breath sounds. No wheezing.  Abdominal:     General: There is no distension.     Palpations: Abdomen is soft.  Musculoskeletal:        General: Normal range of motion.     Cervical back: Normal range of motion and neck supple.  Skin:    General: Skin is warm and dry.     Findings: No erythema.  Neurological:     Mental Status: She is alert and oriented to person, place, and time. Mental status is at baseline.     Motor: No abnormal muscle tone.  Psychiatric:        Mood and Affect: Mood and affect normal.       LABORATORY STUDIES    Latest Ref  Rng & Units 10/10/2024    5:21 AM 10/09/2024    5:31 AM 10/07/2024    8:24 AM  CBC  WBC 4.0 - 10.5 K/uL 13.5  14.4  19.3   Hemoglobin 12.0 - 15.0 g/dL 9.4  9.4  89.8   Hematocrit 36.0 - 46.0 % 30.1  29.2  30.6   Platelets 150 - 400 K/uL 237  238  248       Latest Ref Rng & Units 10/09/2024    5:31 AM 10/07/2024    8:24 AM 10/06/2024    5:09 AM  CMP  Glucose 70 - 99 mg/dL 870  895  98   BUN 8 - 23 mg/dL 16  19  28    Creatinine 0.44 - 1.00 mg/dL 9.36  9.21  8.92   Sodium 135 - 145 mmol/L 136  135  138   Potassium 3.5 - 5.1 mmol/L 3.8  3.4  3.2   Chloride 98 - 111 mmol/L 100  99  100   CO2 22 - 32 mmol/L 26  27  23    Calcium  8.9 - 10.3 mg/dL 8.2  7.8  7.4   Total Protein 6.5 - 8.1 g/dL 5.7   5.7   Total Bilirubin 0.0 - 1.2 mg/dL 0.5   1.1   Alkaline Phos 38 - 126 U/L 98   76   AST 15 - 41 U/L 76   729   ALT 0 - 44 U/L 292   661      RADIOGRAPHIC STUDIES: I have personally reviewed the radiological images as listed and agreed with the findings in the report. DG OR UROLOGY CYSTO IMAGE ([facility] ONLY) Result Date: 10/08/2024 There is no interpretation for this exam.  This order is for images obtained during a surgical procedure.  Please See Surgeries Tab for more information regarding the procedure.   DG Chest Portable 1 View Result Date: 10/05/2024 EXAM: 1 VIEW(S) XRAY OF THE CHEST 10/05/2024 03:51:00 PM COMPARISON: 10/05/2024 at 1:03 PM. CLINICAL HISTORY: Shortness of breath. FINDINGS: LUNGS AND PLEURA: New diffuse interstitial opacities  likely representing edema. No pleural effusion. No pneumothorax. HEART AND MEDIASTINUM: No acute abnormality of the cardiac and mediastinal silhouettes. BONES AND SOFT TISSUES: No acute osseous abnormality. IMPRESSION: 1. New diffuse interstitial opacities, likely representing edema. Electronically signed by: Lonni Necessary MD 10/05/2024 04:34 PM EST RP Workstation: [id number]   CT ABDOMEN PELVIS W CONTRAST Result Date: 10/05/2024 CLINICAL DATA:   Weakness, anemia, prior abnormal CT EXAM: CT ABDOMEN AND PELVIS WITH CONTRAST TECHNIQUE: Multidetector CT imaging of the abdomen and pelvis was performed using the standard protocol following bolus administration of intravenous contrast. RADIATION DOSE REDUCTION: This exam was performed according to the departmental dose-optimization program which includes automated exposure control, adjustment of the mA and/or kV according to patient size and/or use of iterative reconstruction technique. CONTRAST:  OMNIPAQUE  IOHEXOL  300 MG/ML  SOLN COMPARISON:  08/22/2024 FINDINGS: Lower chest: Faint ground-glass opacities are seen at the lung bases, which may reflect aspiration, infection, or edema. Trace bilateral pleural effusions, left greater than right. Hepatobiliary: No focal liver abnormality is seen. No gallstones, gallbladder wall thickening, or biliary dilatation. Pancreas: Unremarkable. No pancreatic ductal dilatation or surrounding inflammatory changes. Spleen: Normal in size without focal abnormality. Adrenals/Urinary Tract: The kidneys enhance normally and symmetrically. No evidence of urinary tract calculi or obstruction. The adrenals are unremarkable. The bladder is moderately distended. Heterogeneous areas of increased attenuation are seen throughout the bladder lumen, consistent with blood products. Irregular mass along the right lateral bladder wall measuring 5.5 x 1.5 by 5.2 cm. Cystoscopy is recommended for further evaluation. Stomach/Bowel: No bowel obstruction or ileus. Prior sigmoid colon resection and reanastomosis. No bowel wall thickening or inflammatory change. Vascular/Lymphatic: Scattered atherosclerosis of the aorta and its branches unchanged. No pathologic adenopathy. Reproductive: Uterus and bilateral adnexa are unremarkable. Other: No free fluid or free intraperitoneal gas. No abdominal wall hernia. Musculoskeletal: There are no acute or destructive bony abnormalities. Reconstructed images  demonstrate no additional findings. IMPRESSION: 1. Large irregular mass along the right lateral wall the bladder, highly concerning for malignancy. Cystoscopy is recommended for further evaluation. 2. High density material throughout the bladder lumen consistent with blood products, please correlate with urinalysis. 3. Scattered bibasilar ground-glass airspace disease, which may be related to edema, infection, or inflammation. 4. Trace bilateral pleural effusions, left greater than right. 5.  Aortic Atherosclerosis (ICD10-I70.0). Electronically Signed   By: Ozell Daring M.D.   On: 10/05/2024 16:03   DG Chest Portable 1 View Result Date: 10/05/2024 CLINICAL DATA:  Sepsis, weakness EXAM: PORTABLE CHEST 1 VIEW COMPARISON:  01/09/2010 FINDINGS: Single frontal view of the chest demonstrates an unremarkable cardiac silhouette. No acute airspace disease, effusion, or pneumothorax. Small nodules are seen at the right lung base, largest measuring 5 mm, corresponding to findings on recent abdominal CT. No acute bony abnormalities. IMPRESSION: 1. Subcentimeter pulmonary nodules at the right lung base, corresponding to prior CT findings. These are indeterminate and require close follow-up in light of bladder abnormalities observed on prior exam. 2. No acute airspace disease. Electronically Signed   By: Ozell Daring M.D.   On: 10/05/2024 14:39   CT HEMATURIA WORKUP Result Date: 08/22/2024 EXAM: CT UROGRAM 08/22/2024 04:28:14 PM TECHNIQUE: CT of the abdomen and pelvis was performed before and after the administration of intravenous contrast as per CT urogram protocol. Multiplanar reformatted images as well as MIP urogram images are provided for review. Automated exposure control, iterative reconstruction, and/or weight based adjustment of the mA/kV was utilized to reduce the radiation dose to as low as reasonably  achievable. CONTRAST: 100 cc omnipaque  300 COMPARISON: None available. CLINICAL HISTORY: Hematuria.  FINDINGS: LOWER CHEST: 4 mm calcified granuloma in the left lower lobe on image 10 series 3. Several other small nodules in the 2 to 3 mm range in the lung bases are observed and there is a 5 x 4 mm right middle lobe nodule on image 14 series 4. According to the Fleischner Society pulmonary nodule recommendations, the finding is consistent with multiple solid nodules, with the largest nodule measuring 5 x 4 mm, and the recommendation is: for low-risk patients, no routine follow-up is required; for high-risk patients, optional CT at 12 months. Low density blood pool compatible with anemia. Mild mitral valve calcification. Descending thoracic aortic atherosclerotic vascular calcification. Small posterior eventration of the right hemidiaphragm contains adipose tissue. LIVER: Relatively atrophic left hepatic lobe. GALLBLADDER AND BILE DUCTS: Gallbladder is unremarkable. No biliary ductal dilatation. SPLEEN: No acute abnormality. PANCREAS: No acute abnormality. ADRENAL GLANDS: No acute abnormality. KIDNEYS, URETERS AND BLADDER: Roush catheter is present in the urinary bladder with urinary bladder wall thickening, cystitis not excluded. Small amount of gas and fluid in the urinary bladder. No significant abnormal filling defect identified in the renal collecting systems or ureters. I don't see an obvious mass in the urinary bladder although portions of the urinary bladder are thicker than others (for example on the right on image 67 series 12). Reportedly head catheter placement there was feculent urine return concerning for colovesical fistula; the urinary bladder is in close proximity to the cecum and loops of small bowel but a definite fistula is not identified. There is an anastomotic staple line in the rectum but the uterus is still present providing some separation of the urinary bladder from the postoperative portion of the rectum. Cystogram or limited barium enema could be utilized to further assess for fistula if  clinically indicated. No stones in the kidneys or ureters. No hydronephrosis. No perinephric or periureteral stranding. GI AND BOWEL: Stomach demonstrates no acute abnormality. There is no bowel obstruction. Retrocecal soft tissue density is likely chronic and therapy related. There is an anastomotic staple line in the rectum but the uterus is still present providing some separation of the urinary bladder from the postoperative portion of the rectum. PERITONEUM AND RETROPERITONEUM: No ascites. No free air. VASCULATURE: Systemic atherosclerosis is present, including the aorta and iliac arteries. Atheromatous plaque dorsally at the origins of the celiac trunk and superior mesenteric artery. LYMPH NODES: No lymphadenopathy. REPRODUCTIVE ORGANS: The uterus is still present providing some separation of the urinary bladder from the postoperative portion of the rectum. BONES AND SOFT TISSUES: Sclerosis along the sacral ala adjacent to the sacroiliac joint, possibly degenerative or due to remote sacral insufficiency fracture. Grade 1 degenerative anterolisthesis at L3-L4 and L4-L5. No focal soft tissue abnormality. IMPRESSION: 1. 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 Further evaluation with cystogram or limited barium enema could be considered if clinically indicated. 2. Sclerosis along the sacral ala adjacent to the sacroiliac joint and grade 1 degenerative anterolisthesis at L3-4 and L4-5. 3. Additional chronic and incidental findings include low-density blood pool compatible with anemia, systemic atherosclerosis, mild mitral valve calcification, small posterior right hemidiaphragm eventration, relative left hepatic lobe atrophy, chronic-appearing retrocecal soft  tissue density, and small pulmonary nodules for  which a follow-up CT at 12 months is optional for high-risk patients but not indicated for low-risk patients. 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 Urology Dr. Francisca has discussed with the patient about pathology findings and treatment options.  Patient is not interested in radical cystectomy/urinary diversion. I recommend complete staging with a CT chest with contrast.  This can be done outpatient. Recommend Bladder-sparing trimodality therapy-low-dose biweekly gemcitabine with radiation concurrently.  Will Corcoran the patient to reestablish care with radiation oncology outpatient. Patient is interested.  Will arrange patient to follow-up after discharge.  # Recurrent hematuria, status post Foley placement.  Follow-up with urology.  Foley likely will be discontinued tomorrow.  # Acute blood loss anemia.  Status post PRBC transfusion And hemoglobin has improved to 9.4.  Monitor.  # Leukocytosis, possibly reactive.  Monitor.  # Transaminitis, improving.  Possibly due to hypoxia from severe anemia.  Check hepatitis  # History of colorectal cancer status post surgery, chemotherapy/radiation.  Previous oncology records not available in current EMR.  Consider genetic testing  Thank you for allowing me to participate in the care of this patient.   Zelphia Cap, MD, PhD Hematology Oncology 10/10/2024     [1] No Known Allergies [2]  Current Facility-Administered Medications:    0.9 %  sodium chloride  infusion, , Intravenous, Once, [name], MD   acetaminophen  (TYLENOL ) tablet 650 mg, 650 mg, Oral, Q6H PRN, 650 mg at 10/09/24 2157 **OR** acetaminophen  (TYLENOL ) suppository 650 mg, 650 mg, Rectal, Q6H PRN, [name], MD   Chlorhexidine  Gluconate Cloth 2 % PADS 6 each, 6  each, Topical, Daily, [name], MD, 6 each at 10/10/24 9071   hydrOXYzine  (ATARAX ) tablet 10 mg, 10 mg, Oral, Once, [name], MD   metoprolol  tartrate (LOPRESSOR ) tablet 12.5 mg, 12.5 mg, Oral, BID, [name], MD, 12.5 mg at 10/10/24 9071   ondansetron  (ZOFRAN ) tablet 4 mg, 4 mg, Oral, Q6H PRN **OR** ondansetron  (ZOFRAN ) injection 4 mg, 4 mg, Intravenous, Q6H PRN, [name], MD, 4 mg at 10/08/24 1140   oxyCODONE  (Oxy IR/ROXICODONE ) immediate release tablet 5 mg, 5 mg, Oral, Q4H PRN, [name], MD   polycarbophil (FIBERCON) tablet 625 mg, 625 mg, Oral, BID, [place], MD, 625 mg at 10/10/24 1341   potassium chloride  SA (KLOR-CON  M) CR tablet 40 mEq, 40 mEq, Oral, Once, [name], MD  "

## 2024-10-10 NOTE — Progress Notes (Addendum)
" °  PROGRESS NOTE    Cindy Finley  FMW:[id number] DOB: 09/12/1940 DOA: 10/05/2024 PCP: [name], MD  [id number]  LOS: 4 days   Brief hospital course:   Assessment & Plan: Cindy Finley is a 84 y.o. female with a PMH significant for HLD, colon cancer s/p surgery and chemoradiation in 2001, hematuria with recurrent UTIs due to Enterococcus faecalis who came to [facility] ED for severe anemia with hematuria from urology office.     Gross hematuria  Bladder mass  Invasive high-grade urothelial cell carcinoma  --cystoscopy and biopsy on 2/2 --Need to keep foley in for 3 days --onc consult today  Severe symptomatic anemia from acute blood loss --Hgb 3.6 on presentation.  improved to 10 next day s/p 4 units pRBC  --monitor Hgb   Sepsis, ruled out UTI ruled out --urine cx insignificant growth --d/c'ed Vanc and zosyn    Acute hypoxia due to severe anemia and fluid overload - briefly on bipap overnight. Improved to 2L next day.  Now weaned down to RA.  Afib/sinus tachycardia --Was given a bolus of amiodarone  and unclear how much was distributed intravenously as the IV infiltrated.  - HR improved  --cont Lopressor  (new)   history of colorectal cancer 20 years ago  --received radiation    Transaminitis - May be part of severe anemia - Will continue to monitor  Acute hypoxic respiratory failure  --now on room air.  AKI --Cr 1.31 on presentation, improved to 0.63 this morning.  Lactic acidosis --trend   DVT prophylaxis: SCD/Compression stockings Code Status: DNR  Family Communication:  Level of care: Med-Surg Dispo:   The patient is from: home Anticipated d/c is to: home Anticipated d/c date is: tomorrow   Subjective and Interval History:  Pt reported loose stool leakage.   Objective: Vitals:   10/09/24 1500 10/09/24 2041 10/10/24 0455 10/10/24 0804  BP: (!) 128/58 130/60 119/63 124/60  Pulse: 100 99 89 92  Resp: 18 17 16    Temp: 98 F (36.7 C) 98.6 F (37  C) 97.9 F (36.6 C) 97.6 F (36.4 C)  TempSrc: Oral  Oral Oral  SpO2: 95% 93% 91% 95%  Weight:      Height:        Intake/Output Summary (Last 24 hours) at 10/10/2024 1842 Last data filed at 10/10/2024 1008 Gross per 24 hour  Intake 480 ml  Output --  Net 480 ml   Filed Weights   10/06/24 1919 10/08/24 0952  Weight: 55.3 kg 55.3 kg    Examination:   Constitutional: NAD, AAOx3 HEENT: conjunctivae and lids normal, EOMI CV: No cyanosis.   RESP: normal respiratory effort, on RA Neuro: II - XII grossly intact.   Psych: Normal mood and affect.  Appropriate judgement and reason  Foley present, urine turning red again   Data Reviewed: I have personally reviewed labs and imaging studies  Time spent: 35 minutes  Ellouise Haber, MD [facility] If 7PM-7AM, please contact night-coverage 10/10/2024, 6:42 PM   "

## 2024-10-10 NOTE — Progress Notes (Signed)
" ° °  Subjective No issues overnight, Foley clear Denies any pain  Physical Exam: BP 124/60 (BP Location: Right Arm)   Pulse 92   Temp 97.6 F (36.4 C) (Oral)   Resp 16   Ht 5' 2 (1.575 m)   Wt 55.3 kg   SpO2 95%   BMI 22.31 kg/m    Constitutional:  Alert and oriented, No acute distress. Respiratory: Normal respiratory effort, no increased work of breathing. 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  Foley should remain in place at least 3 to 5 days for bladder healing in the setting of extensive resection.  She lives alone and is adamant she cannot care for the catheter at home.  Discussed with hospitalist and nursing as well, and patient refused Foley education.   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  We discussed primary treatment options include neoadjuvant chemotherapy followed by radical cystoprostatectomy with urinary diversion, versus tri-modal therapy with a bladder sparing approach.  Cystectomy and diversion is a morbid surgery with 5 to 10-day hospitalization, risks of bleeding, infection, injury to surrounding structures, ileus, prolonged recovery, and a readmission rate of nearly 30%.  We discussed this is done both open and robotically, and is dependent on surgeon preference and experience, but both have similar oncologic outcomes.  We discussed that long-term prognosis is dependent on numerous factors, including response to neoadjuvant chemotherapy, and most importantly final staging after cystectomy.  We discussed options for diversion including  ileal conduit (most common), neobladder, and [place]  pouch, and the differences in recovery and risks/benefits of the different diversions.   Tri-modal therapy includes maximal TURBT, chemotherapy, radiation, and close long-term surveillance with clinic cystoscopy and CT scans.  There is less evidence about the long-term durability of tri-modal therapy versus neoadjuvant chemotherapy and cystectomy, but may be appropriate in carefully selected patients ( I.e solitary tumor, ability to maximally resect disease endoscopically, strong desire for bladder sparing approach, or patients that have co-morbidities that preclude eligibility for cystectomy).  She is adamantly opposed to considering cystectomy and urinary diversion, but may be interested in trimodal therapy.  Will need to discuss with radiation oncology if she is a candidate for pelvic radiation with her history of colorectal cancer that was also treated with radiation 20 years ago.  I have also reached out to Dr. Babara with oncology who will evaluate the patient this hospitalization.  - Oncology consult, discussed case with Dr. Babara.  Defer additional staging imaging like PET scan or CT chest oncology recommendations - Can follow-up with radiation oncology outpatient, I will also message Dr. Lenn through epic - Plan Foley removal tomorrow morning and voiding trial, can discharge tomorrow afternoon if voiding without issue - Plan 6-week follow-up urology clinic  A total of 30 minutes were spent on the floor with greater than 50% face-to-face with the patient with discussion of the new diagnosis of bladder cancer, treatment options, and Foley catheter duration at least 3 to 5 days for healing.  Also updated her brother-in-law Ron over the phone.    Cindy JAYSON Burnet, MD    "

## 2024-10-10 NOTE — Plan of Care (Signed)

## 2024-10-11 ENCOUNTER — Telehealth: Payer: Self-pay | Admitting: Radiation Oncology

## 2024-10-11 ENCOUNTER — Encounter: Payer: Self-pay | Admitting: Oncology

## 2024-10-11 ENCOUNTER — Other Ambulatory Visit: Payer: Self-pay

## 2024-10-11 ENCOUNTER — Telehealth: Payer: Self-pay | Admitting: Oncology

## 2024-10-11 DIAGNOSIS — C679 Malignant neoplasm of bladder, unspecified: Secondary | ICD-10-CM

## 2024-10-11 LAB — HEPATITIS PANEL, ACUTE
HCV Ab: NONREACTIVE
Hep A IgM: NONREACTIVE
Hep B C IgM: NONREACTIVE
Hepatitis B Surface Ag: NONREACTIVE

## 2024-10-11 LAB — CBC
HCT: 28.9 % — ABNORMAL LOW (ref 36.0–46.0)
Hemoglobin: 9 g/dL — ABNORMAL LOW (ref 12.0–15.0)
MCH: 27.4 pg (ref 26.0–34.0)
MCHC: 31.1 g/dL (ref 30.0–36.0)
MCV: 87.8 fL (ref 80.0–100.0)
Platelets: 217 10*3/uL (ref 150–400)
RBC: 3.29 MIL/uL — ABNORMAL LOW (ref 3.87–5.11)
RDW: 17.9 % — ABNORMAL HIGH (ref 11.5–15.5)
WBC: 11.2 10*3/uL — ABNORMAL HIGH (ref 4.0–10.5)
nRBC: 0 % (ref 0.0–0.2)

## 2024-10-11 LAB — LACTIC ACID, PLASMA: Lactic Acid, Venous: 1.4 mmol/L (ref 0.5–1.9)

## 2024-10-11 MED ORDER — ASPIRIN 81 MG PO TBEC: 81.0000 mg | DELAYED_RELEASE_TABLET | Freq: Every day | ORAL | Status: AC

## 2024-10-11 MED ORDER — METOPROLOL SUCCINATE ER 25 MG PO TB24
12.5000 mg | ORAL_TABLET | Freq: Every day | ORAL | 2 refills | Status: AC
  Filled 2024-10-11: qty 15, 30d supply, fill #0

## 2024-10-11 NOTE — Plan of Care (Signed)
" °  Problem: Education: Goal: Knowledge of General Education information will improve Description: Including pain rating scale, medication(s)/side effects and non-pharmacologic comfort measures Outcome: Adequate for Discharge   Problem: Health Behavior/Discharge Planning: Goal: Ability to manage health-related needs will improve Outcome: Adequate for Discharge   Problem: Clinical Measurements: Goal: Ability to maintain clinical measurements within normal limits will improve Outcome: Adequate for Discharge Goal: Will remain free from infection Outcome: Adequate for Discharge Goal: Diagnostic test results will improve Outcome: Adequate for Discharge Goal: Respiratory complications will improve Outcome: Adequate for Discharge Goal: Cardiovascular complication will be avoided Outcome: Adequate for Discharge   Problem: Activity: Goal: Risk for activity intolerance will decrease Outcome: Adequate for Discharge   Problem: Nutrition: Goal: Adequate nutrition will be maintained Outcome: Adequate for Discharge   Problem: Coping: Goal: Level of anxiety will decrease Outcome: Adequate for Discharge   Problem: Elimination: Goal: Will not experience complications related to bowel motility Outcome: Adequate for Discharge Goal: Will not experience complications related to urinary retention Outcome: Adequate for Discharge   Problem: Pain Managment: Goal: General experience of comfort will improve and/or be controlled Outcome: Adequate for Discharge   Problem: Safety: Goal: Ability to remain free from injury will improve Outcome: Adequate for Discharge   Problem: Skin Integrity: Goal: Risk for impaired skin integrity will decrease Outcome: Adequate for Discharge   Problem: Education: Goal: Knowledge of the prescribed therapeutic regimen will improve Outcome: Adequate for Discharge   Problem: Bowel/Gastric: Goal: Gastrointestinal status for postoperative course will  improve Outcome: Adequate for Discharge   Problem: Cardiac: Goal: Ability to maintain an adequate cardiac output Outcome: Adequate for Discharge Goal: Will show no evidence of cardiac arrhythmias Outcome: Adequate for Discharge   Problem: Nutritional: Goal: Will attain and maintain optimal nutritional status Outcome: Adequate for Discharge   Problem: Neurological: Goal: Will regain or maintain usual level of consciousness Outcome: Adequate for Discharge   Problem: Clinical Measurements: Goal: Ability to maintain clinical measurements within normal limits Outcome: Adequate for Discharge Goal: Postoperative complications will be avoided or minimized Outcome: Adequate for Discharge   Problem: Respiratory: Goal: Will regain and/or maintain adequate ventilation Outcome: Adequate for Discharge Goal: Respiratory status will improve Outcome: Adequate for Discharge   Problem: Skin Integrity: Goal: Demonstrates signs of wound healing without infection Outcome: Adequate for Discharge   Problem: Urinary Elimination: Goal: Will remain free from infection Outcome: Adequate for Discharge Goal: Ability to achieve and maintain adequate urine output Outcome: Adequate for Discharge   "

## 2024-10-11 NOTE — Telephone Encounter (Addendum)
 CT orders are in.   Kim/ Katheryn- referral to rad onc placed in epic. Re: bladder cancer

## 2024-10-11 NOTE — Telephone Encounter (Signed)
 2/5 Left voicemail after received referral from Dr. Marval office, patient was seen and treatment by Dr. Lenn -RadOnc in [place], [place].  Waiting on call back to confirm, if referral sent to right office to be sch for consult.

## 2024-10-11 NOTE — Progress Notes (Signed)
" ° °  Subjective No acute events overnight, continues to deny any pain Urine faint pink  Physical Exam: BP (!) 128/58   Pulse 89   Temp 97.9 F (36.6 C) (Oral)   Resp 16   Ht 5' 2 (1.575 m)   Wt 55.3 kg   SpO2 92%   BMI 22.31 kg/m    Constitutional:  Alert and oriented, No acute distress. Respiratory: Normal respiratory effort, no increased work of breathing. GI: Abdomen is soft, non-tender, non-distended Drains: Foley with light pink urine, clear  Laboratory Data: Reviewed, hematocrit stable  Assessment & Plan:   84 year old frail female who lives alone, POD#1 transurethral resection of large bladder tumor.  Foley should remain in place at least 3 to 5 days for bladder healing in the setting of extensive resection.  She lives alone and is adamant she cannot care for the catheter at home.  Discussed with hospitalist and nursing as well, and patient refused Foley education.  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  Foley removed this morning, has been urinating spontaneously light pink urine without issue, no abdominal pain Follow-up with oncology and radiation oncology Can resume low-dose aspirin  2/9 RTC with urology 4 weeks  [name], MD    "

## 2024-10-11 NOTE — Discharge Summary (Incomplete)
 "  Physician Discharge Summary   [place]  female DOB: 13-Mar-1941  FMW:[id number]  PCP: Cindy Lame, MD  Admit date: 10/05/2024 Discharge date: ***  Admitted From: home Disposition:  home CODE STATUS: DNR  Discharge Instructions     Discharge instructions   Complete by: As directed    You can resume your aspirin  81 mg daily on 10/15/24.  You are started on Toprol  12.5 mg daily for fast heart rate. - -   No wound care   Complete by: As directed       Hospital Course:  For full details, please see H&P, progress notes, consult notes and ancillary notes.  Briefly,  ***  F/u with urology and onc Dr. Babara  Unless noted above, medications under STOP list are ones pt was not taking PTA.  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 The score is based on dignosis, age, lab data, medications, orders, and past utilization.   Low:  0-14.9   Medium: 15-21.9   High: 22-29.9   Extreme: 30 and above         Discharge Instructions:  Allergies as of 10/11/2024   No Known Allergies      Medication List     STOP taking these medications    nitrofurantoin  (macrocrystal-monohydrate) 100 MG capsule Commonly known as: MACROBID        TAKE these medications    aspirin  EC 81 MG tablet Take 1 tablet (81 mg total) by mouth daily. Start taking on: October 15, 2024   b complex vitamins capsule Take 1  capsule by mouth daily.   Calcium  Carbonate-Vitamin D 600-400 MG-UNIT tablet Take 1 tablet by mouth daily.   Cholecalciferol 25 MCG (1000 UT) tablet Take 1,000 Units by mouth daily.   Fish Oil 1000 MG Caps Take 1,000 mg by mouth daily.   Melatonin 10 MG Tabs Take 10 mg by mouth at bedtime.   metoprolol  succinate 25 MG 24 hr tablet Commonly known as: Toprol  XL Take 0.5 tablets (12.5 mg total) by mouth daily.   Multi-Vitamin tablet Take 1 tablet by mouth daily.   psyllium 95 % Pack Commonly known as: HYDROCIL/METAMUCIL Take 1 packet by mouth daily.   simvastatin 10 MG tablet Commonly known as: ZOCOR Take 10 mg by mouth daily.   vitamin E 180 MG (400 UNITS) capsule Take 400 Units by mouth daily.         Follow-up Information     Cindy Redell BROCKS, MD Follow up in 4 week(s).   Specialty: Urology Contact information: [address] [place] [address] [phone]         [name], MD Follow up.   Specialty: Oncology Contact information: [address] [place] [address] [phone]         Cindy Lame, MD Follow up in 1 week(s).   Specialty: Family Medicine Contact information: [address]  [facility] [place] [address] [phone]                 Allergies[1]   The results of significant diagnostics from this hospitalization (including imaging, microbiology, ancillary and laboratory) are listed below for reference.   Consultations:   Procedures/Studies: DG OR UROLOGY CYSTO IMAGE ([facility] ONLY) Result Date: 10/08/2024 There is no interpretation for this exam.  This order is for images obtained during a surgical procedure.  Please See Surgeries Tab for more information regarding the procedure.   DG Chest Portable 1 View Result Date: 10/05/2024 EXAM: 1 VIEW(S) XRAY OF THE CHEST 10/05/2024 03:51:00 PM COMPARISON: 10/05/2024 at 1:03 PM. CLINICAL HISTORY: Shortness of breath.  FINDINGS: LUNGS AND PLEURA: New diffuse interstitial opacities likely representing edema. No pleural effusion. No pneumothorax. HEART AND MEDIASTINUM: No acute abnormality of the cardiac and mediastinal silhouettes. BONES AND SOFT TISSUES: No acute osseous abnormality. IMPRESSION: 1. New diffuse interstitial opacities, likely representing edema. Electronically signed by: Lonni Necessary MD 10/05/2024 04:34 PM EST RP Workstation: [id number]   CT ABDOMEN PELVIS W CONTRAST Result Date: 10/05/2024 CLINICAL DATA:  Weakness, anemia, prior abnormal CT EXAM: CT ABDOMEN AND PELVIS WITH CONTRAST TECHNIQUE: Multidetector CT imaging of the abdomen and pelvis was performed using the standard protocol following bolus administration of intravenous contrast. RADIATION DOSE REDUCTION: This exam was performed according to the departmental dose-optimization program which includes automated exposure control, adjustment of the mA and/or kV according to patient size and/or use of iterative reconstruction technique. CONTRAST:  OMNIPAQUE  IOHEXOL  300 MG/ML  SOLN COMPARISON:  08/22/2024 FINDINGS: Lower chest: Faint ground-glass opacities are seen at the lung bases, which may reflect aspiration, infection, or edema. Trace bilateral pleural effusions, left greater than right. Hepatobiliary: No focal liver abnormality is seen. No gallstones, gallbladder wall thickening, or biliary dilatation. Pancreas: Unremarkable. No pancreatic ductal dilatation or surrounding inflammatory changes. Spleen: Normal in size without focal abnormality. Adrenals/Urinary Tract: The kidneys enhance normally and symmetrically. No evidence of urinary tract calculi or obstruction. The adrenals are unremarkable. The bladder is moderately distended. Heterogeneous areas of increased attenuation are seen throughout the bladder lumen, consistent with blood products. Irregular mass along the right lateral bladder wall measuring 5.5 x 1.5 by 5.2 cm. Cystoscopy is  recommended for further evaluation. Stomach/Bowel: No bowel obstruction or ileus. Prior sigmoid colon resection and reanastomosis. No bowel wall thickening or inflammatory change. Vascular/Lymphatic: Scattered atherosclerosis of the aorta and its branches unchanged. No pathologic adenopathy. Reproductive: Uterus and bilateral adnexa are unremarkable. Other: No free fluid or free intraperitoneal gas. No abdominal wall hernia. Musculoskeletal: There are no acute or destructive bony abnormalities. Reconstructed images demonstrate no additional findings. IMPRESSION: 1. Large irregular mass along the right lateral wall the bladder, highly concerning for malignancy. Cystoscopy is recommended for further evaluation. 2. High density material throughout the bladder lumen consistent with blood products, please correlate with urinalysis. 3. Scattered bibasilar ground-glass airspace disease, which may be related to edema, infection, or inflammation. 4. Trace bilateral pleural effusions, left greater than right. 5.  Aortic Atherosclerosis (ICD10-I70.0). Electronically Signed   By: Ozell Daring M.D.   On: 10/05/2024 16:03   DG Chest Portable 1 View Result Date: 10/05/2024 CLINICAL DATA:  Sepsis, weakness EXAM: PORTABLE CHEST 1 VIEW COMPARISON:  01/09/2010 FINDINGS: Single frontal view of the chest demonstrates an unremarkable cardiac silhouette. No acute airspace disease, effusion, or pneumothorax. Small nodules are seen at the right lung base, largest measuring 5 mm, corresponding  to findings on recent abdominal CT. No acute bony abnormalities. IMPRESSION: 1. Subcentimeter pulmonary nodules at the right lung base, corresponding to prior CT findings. These are indeterminate and require close follow-up in light of bladder abnormalities observed on prior exam. 2. No acute airspace disease. Electronically Signed   By: Ozell Daring M.D.   On: 10/05/2024 14:39      Labs: BNP (last 3 results) No results for input(s):  BNP in the last 8760 hours. Basic Metabolic Panel: Recent Labs  Lab 10/05/24 1105 10/06/24 0509 10/07/24 0824 10/09/24 0531  NA 139 138 135 136  K 4.6 3.2* 3.4* 3.8  CL 100 100 99 100  CO2 10* 23 27 26   GLUCOSE 117* 98 104* 129*  BUN 32* 28* 19 16  CREATININE 1.31* 1.07* 0.78 0.63  CALCIUM  8.6* 7.4* 7.8* 8.2*  MG  --   --   --  2.4   Liver Function Tests: Recent Labs  Lab 10/05/24 1235 10/06/24 0509 10/09/24 0531  AST 645* 729* 76*  ALT 604* 661* 292*  ALKPHOS 82 76 98  BILITOT 0.4 1.1 0.5  PROT 6.4* 5.7* 5.7*  ALBUMIN 3.7 3.0* 2.9*   No results for input(s): LIPASE, AMYLASE in the last 168 hours. No results for input(s): AMMONIA in the last 168 hours. CBC: Recent Labs  Lab 10/06/24 1539 10/07/24 0824 10/09/24 0531 10/10/24 0521 10/11/24 0631  WBC 21.2* 19.3* 14.4* 13.5* 11.2*  HGB 9.7* 10.1* 9.4* 9.4* 9.0*  HCT 28.3* 30.6* 29.2* 30.1* 28.9*  MCV 83.0 83.8 85.6 87.5 87.8  PLT 227 248 238 237 217   Cardiac Enzymes: No results for input(s): CKTOTAL, CKMB, CKMBINDEX, TROPONINI in the last 168 hours. BNP: Invalid input(s): POCBNP CBG: No results for input(s): GLUCAP in the last 168 hours. D-Dimer No results for input(s): DDIMER in the last 72 hours. Hgb A1c No results for input(s): HGBA1C in the last 72 hours. Lipid Profile No results for input(s): CHOL, HDL, LDLCALC, TRIG, CHOLHDL, LDLDIRECT in the last 72 hours. Thyroid function studies No results for input(s): TSH, T4TOTAL, T3FREE, THYROIDAB in the last 72 hours.  Invalid input(s): FREET3 Anemia work up No results for input(s): VITAMINB12, FOLATE, FERRITIN, TIBC, IRON, RETICCTPCT in the last 72 hours. Urinalysis    Component Value Date/Time   COLORURINE AMBER (A) 10/05/2024 1315   APPEARANCEUR CLOUDY (A) 10/05/2024 1315   APPEARANCEUR CANCELED 08/22/2024 1103   LABSPEC 1.016 10/05/2024 1315   PHURINE 5.0 10/05/2024 1315   GLUCOSEU 50 (A)  10/05/2024 1315   HGBUR MODERATE (A) 10/05/2024 1315   BILIRUBINUR NEGATIVE 10/05/2024 1315   KETONESUR 5 (A) 10/05/2024 1315   PROTEINUR 100 (A) 10/05/2024 1315   NITRITE NEGATIVE 10/05/2024 1315   LEUKOCYTESUR SMALL (A) 10/05/2024 1315   Sepsis Labs Recent Labs  Lab 10/07/24 0824 10/09/24 0531 10/10/24 0521 10/11/24 0631  WBC 19.3* 14.4* 13.5* 11.2*   Microbiology Recent Results (from the past 240 hours)  Blood culture (routine x 2)     Status: None   Collection Time: 10/05/24 12:36 PM   Specimen: BLOOD LEFT ARM  Result Value Ref Range Status   Specimen Description BLOOD LEFT ARM  Final   Special Requests   Final    BOTTLES DRAWN AEROBIC AND ANAEROBIC Blood Culture adequate volume   Culture   Final    NO GROWTH 5 DAYS Performed at [facility], [address] [place], [place] [address]    Report Status 10/10/2024 FINAL  Final  Blood culture (routine  x 2)     Status: None   Collection Time: 10/05/24 12:41 PM   Specimen: BLOOD  Result Value Ref Range Status   Specimen Description BLOOD RIGHT ANTECUBITAL  Final   Special Requests   Final    BOTTLES DRAWN AEROBIC AND ANAEROBIC Blood Culture adequate volume   Culture   Final    NO GROWTH 5 DAYS Performed at [facility], [address] [place], [place] [address]    Report Status 10/10/2024 FINAL  Final  Urine Culture     Status: Abnormal   Collection Time: 10/05/24  1:15 PM   Specimen: Urine, Random  Result Value Ref Range Status   Specimen Description   Final    URINE, RANDOM Performed at [facility], [address], [place], [place] [address]    Special Requests   Final    NONE Reflexed from [phone] Performed at [facility], [address] [place], [place] [address]    Culture (A)  Final    <10,000 COLONIES/mL INSIGNIFICANT GROWTH Performed at [facility], [address], [place], [place] [address]    Report Status 10/07/2024 FINAL  Final  Surgical PCR  screen     Status: None   Collection Time: 10/08/24  4:52 AM   Specimen: Nasal Mucosa; Nasal Swab  Result Value Ref Range Status   MRSA, PCR NEGATIVE NEGATIVE Final   Staphylococcus aureus NEGATIVE NEGATIVE Final    Comment: (NOTE) The Xpert SA Assay (FDA approved for NASAL specimens in patients 48 years of age and older), is one component of a comprehensive surveillance program. It is not intended to diagnose infection nor to guide or monitor treatment. Performed at [facility], [address] [place], [place] [address]      Total time spend on discharging this patient, including the last patient exam, discussing the hospital stay, instructions for ongoing care as it relates to all pertinent caregivers, as well as preparing the medical discharge records, prescriptions, and/or referrals as applicable, is *** minutes.    Ellouise Haber, MD  [facility] 10/11/2024, 9:04 AM       [1] No Known Allergies  "

## 2024-10-11 NOTE — Telephone Encounter (Signed)
 2/5 Referral was meant to go to [facility] - Dr. Layvonne office -per Almarie, RN.  Referral now closed at this time.

## 2024-10-11 NOTE — Telephone Encounter (Signed)
 Received voicemail for 1C that the patient needs to be scheduled for a follow-up with Dr. Babara in 1-2 weeks. Patient was consulted on as a inpatient. Pt will be discharged today. Please contact the patient to schedule.

## 2024-10-12 NOTE — Anesthesia Postprocedure Evaluation (Signed)
"   Anesthesia Post Note  Patient: [place]  Procedure(s) Performed: TURBT (TRANSURETHRAL RESECTION OF BLADDER TUMOR) (Bladder)  Patient location during evaluation: PACU Anesthesia Type: General Level of consciousness: awake Pain management: satisfactory to patient Vital Signs Assessment: post-procedure vital signs reviewed and stable Respiratory status: spontaneous breathing Cardiovascular status: blood pressure returned to baseline Anesthetic complications: no   No notable events documented.   Last Vitals:  Vitals:   10/11/24 0432 10/11/24 0900  BP: (!) 128/58 (!) 127/56  Pulse: 89 (!) 103  Resp: 16 18  Temp: 36.6 C 36.6 C  SpO2: 92% 96%    Last Pain:  Vitals:   10/11/24 0900  TempSrc: Oral  PainSc:                  VAN STAVEREN,Zamarian Scarano      "

## 2024-10-18 ENCOUNTER — Ambulatory Visit

## 2024-10-22 ENCOUNTER — Inpatient Hospital Stay: Admitting: Oncology

## 2024-10-22 ENCOUNTER — Ambulatory Visit: Admitting: Radiation Oncology

## 2024-11-08 ENCOUNTER — Ambulatory Visit: Admitting: Urology
